# Patient Record
Sex: Female | Born: 1945 | Race: White | Hispanic: No | Marital: Married
Health system: Southern US, Community
[De-identification: ages and names within clinical notes are randomized; demographics above are authoritative.]

---

## 2020-04-29 ENCOUNTER — Other Ambulatory Visit: Payer: Self-pay | Admitting: Internal Medicine

## 2020-04-29 DIAGNOSIS — R413 Other amnesia: Secondary | ICD-10-CM

## 2020-05-14 ENCOUNTER — Ambulatory Visit
Admission: RE | Admit: 2020-05-14 | Discharge: 2020-05-14 | Disposition: A | Discharge status: Home / Self Care | Payer: Medicare Other | Source: Ambulatory Visit | Attending: Internal Medicine | Admitting: Internal Medicine

## 2020-05-14 ENCOUNTER — Encounter (INDEPENDENT_AMBULATORY_CARE_PROVIDER_SITE_OTHER): Payer: Self-pay

## 2020-05-14 ENCOUNTER — Other Ambulatory Visit: Payer: Self-pay

## 2020-05-14 DIAGNOSIS — R413 Other amnesia: Secondary | ICD-10-CM | POA: Diagnosis present

## 2020-05-14 IMAGING — MR MR HEAD W/O CM
12 series · 45 of 48 positions shown · non-contrast
Comparison: None.

CLINICAL DATA: Memory loss

EXAM:
MRI HEAD WITHOUT CONTRAST
TECHNIQUE: Multiplanar, multiecho pulse sequences of the brain and surrounding
structures were obtained without intravenous contrast.

[Series 5: ax dwi_tracew · axial · 3.0mm · 0.60mm/px · z∈[-132,+8]mm · 4 of 44 slices shown]
[im 1/44]
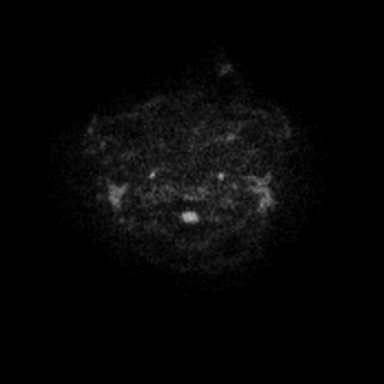
[im 15/44]
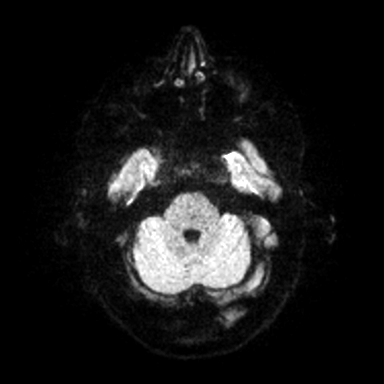
[im 29/44]
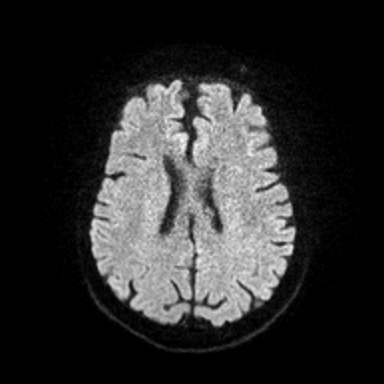
[im 44/44]
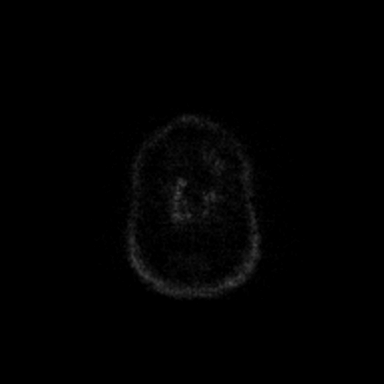

[Series 6: ax dwi_adc · axial · 3.0mm · 0.60mm/px · z∈[-132,+8]mm · 3 of 44 slices shown]
[im 1/44]
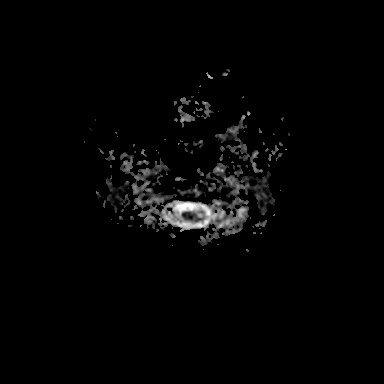
[im 22/44]
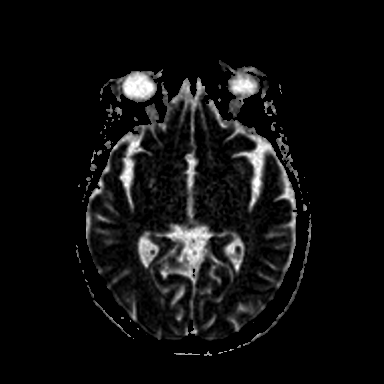
[im 44/44]
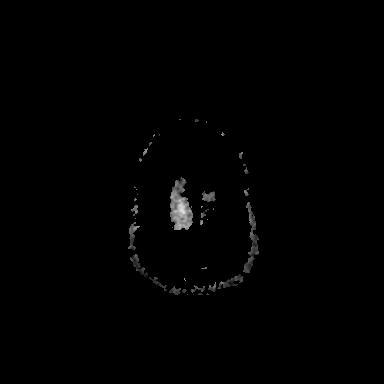

[Series 7: cor dwi_tracew · coronal · 5.0mm · 0.60mm/px · 5 of 64 slices shown]
[im 1/64]
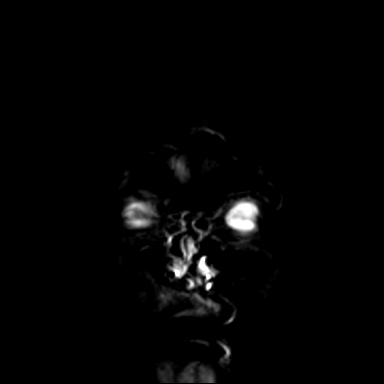
[im 16/64]
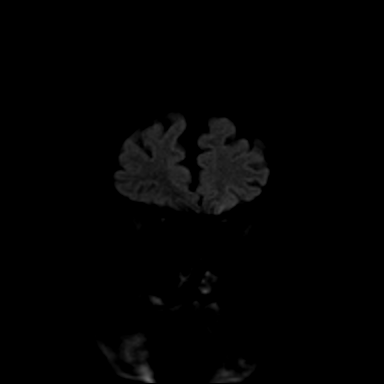
[im 32/64]
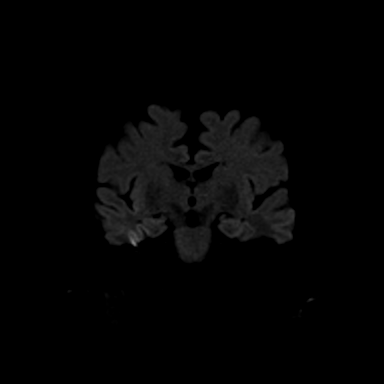
[im 48/64]
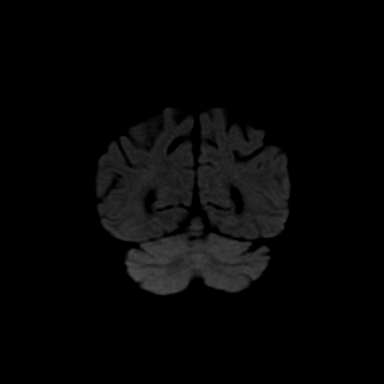
[im 64/64]
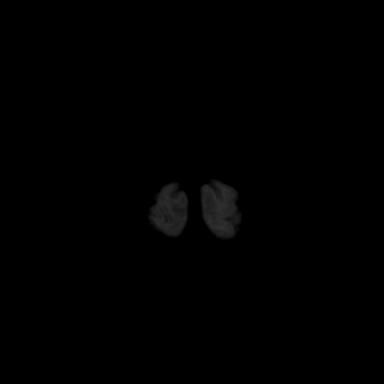

[Series 8: cor dwi_adc · coronal · 5.0mm · 0.60mm/px · 2 of 32 slices shown]
[im 1/32]
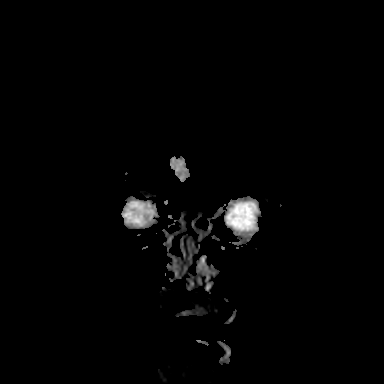
[im 32/32]
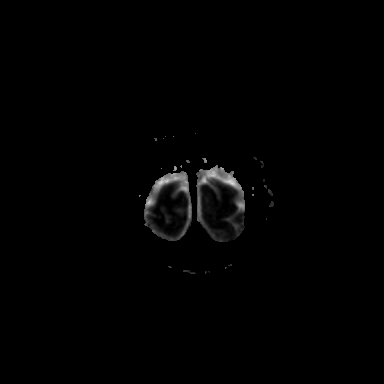

[Series 9: T1 · sagittal · 5.0mm · 0.62mm/px · 1 of 19 slices shown (1 of 2)]
[im 1/19]
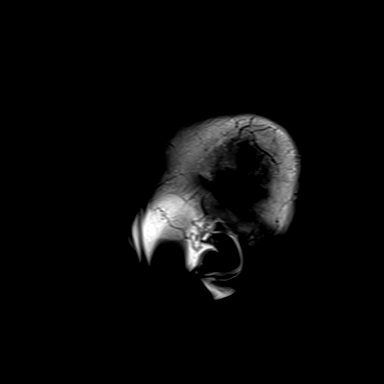

[Series 10: T2 · axial · 5.0mm · 0.53mm/px · z∈[-133,+9]mm · 2 of 25 slices shown (1 of 2)]
[im 1/25]
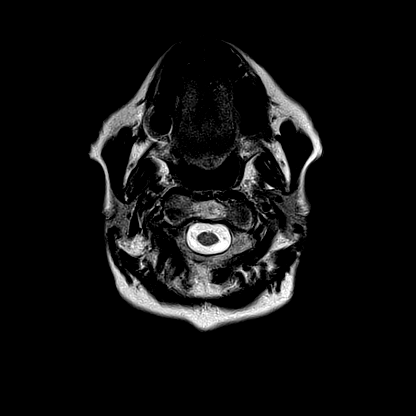
[im 25/25]
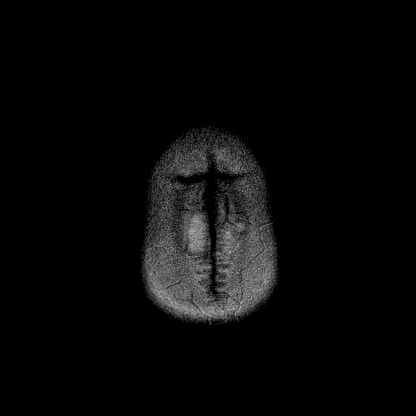

[Series 11: mag_images · axial · 3.0mm · 0.90mm/px · z∈[-148,+27]mm · 5 of 60 slices shown]
[im 1/60]
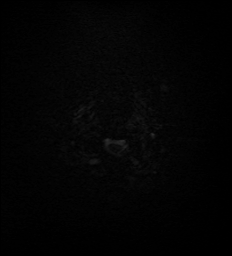
[im 15/60]
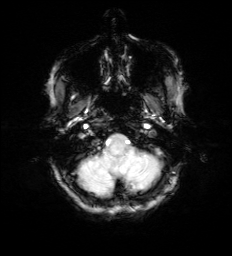
[im 30/60]
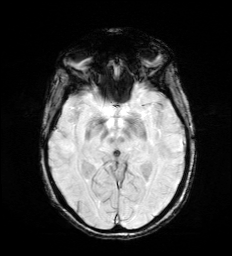
[im 45/60]
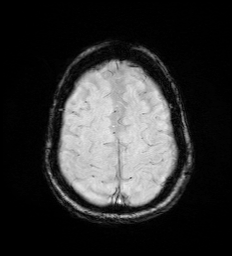
[im 60/60]
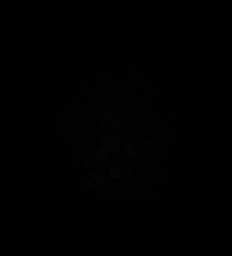

[Series 12: pha_images · axial · 3.0mm · 0.90mm/px · z∈[-148,+27]mm · 4 of 59 slices shown]
[im 1/59]
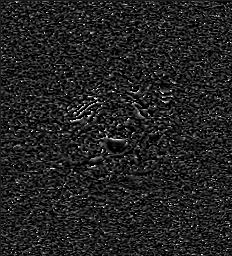
[im 20/59]
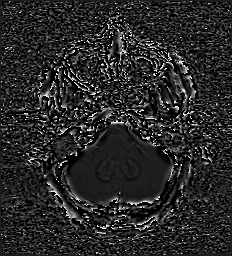
[im 39/59]
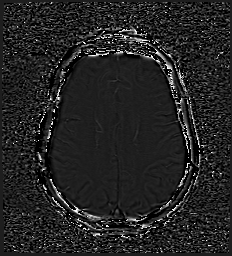
[im 59/59]
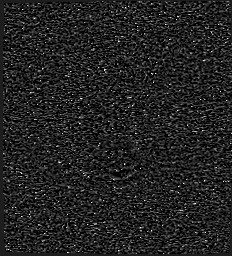

[Series 13: swi_images · axial · 3.0mm · 0.90mm/px · z∈[-148,+27]mm · 5 of 60 slices shown]
[im 1/60]
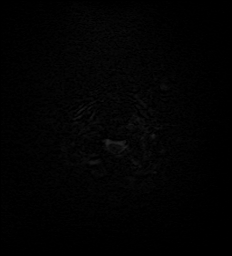
[im 15/60]
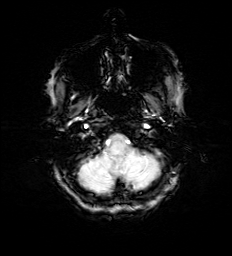
[im 30/60]
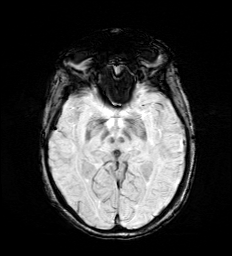
[im 45/60]
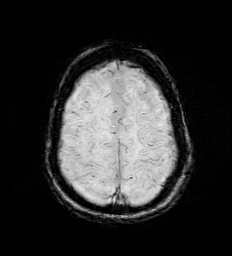
[im 60/60]
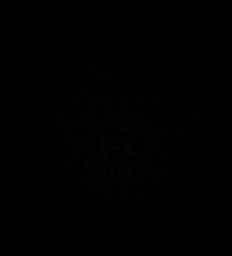

[Series 15: FLAIR · axial · 3.0mm · 0.53mm/px · z∈[-142,+18]mm · 4 of 55 slices shown]
[im 1/55]
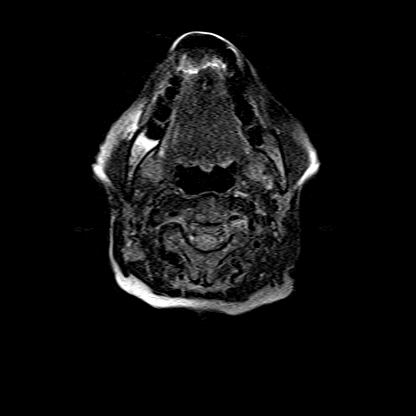
[im 19/55]
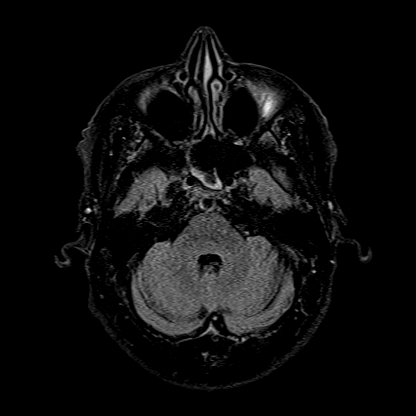
[im 37/55]
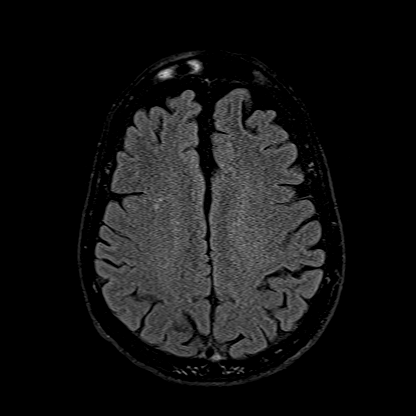
[im 55/55]
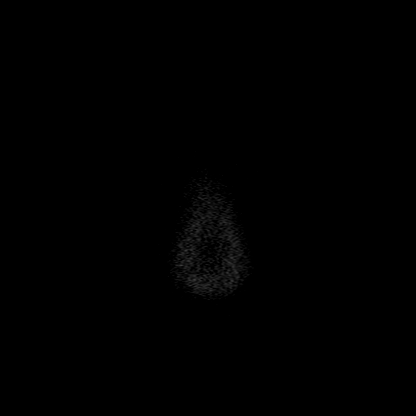

[Series 16: T1 · axial · 1.0mm · 0.98mm/px · z∈[-133,+9]mm · 8 of 144 slices shown (2 of 2)]
[im 1/144]
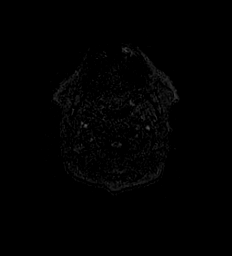
[im 29/144]
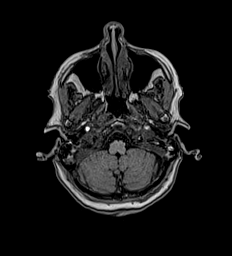
[im 43/144]
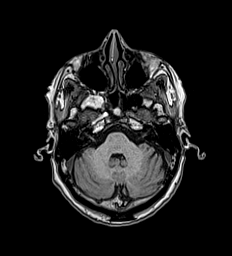
[im 58/144]
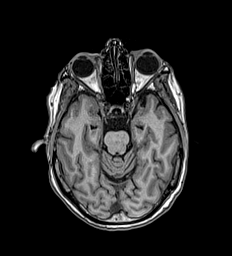
[im 86/144]
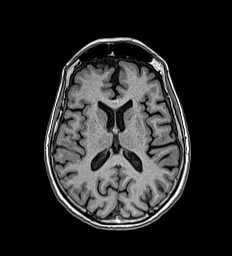
[im 101/144]
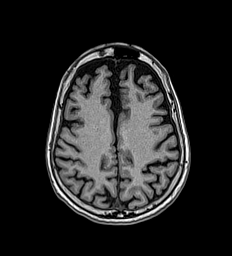
[im 115/144]
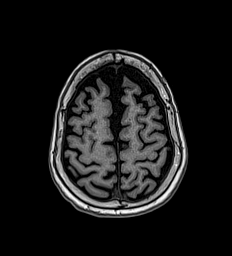
[im 144/144]
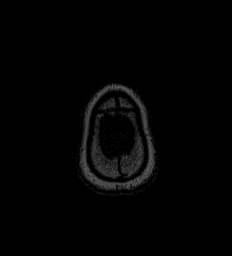

[Series 17: T2 · coronal · 5.0mm · 0.57mm/px · 2 of 26 slices shown (2 of 2)]
[im 1/26]
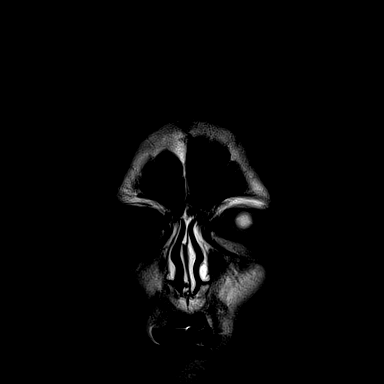
[im 26/26]
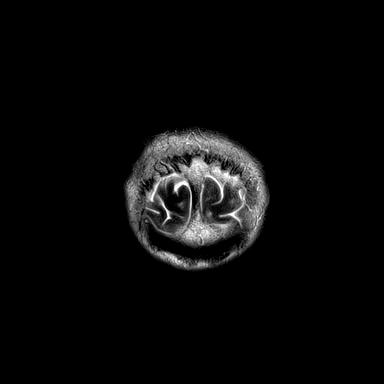

[45 of 48 positions shown; findings below may reference images not displayed]

FINDINGS: Brain: No acute infarction, hemorrhage, hydrocephalus, extra-axial
collection or mass lesion.

Prominence of the cerebral sulci, more evident in the bilateral
frontal regions, reflecting moderate parenchymal volume loss.

Vascular: Normal flow voids.

Skull and upper cervical spine: Normal marrow signal.

Sinuses/Orbits: Mucosal thickening within the right sphenoid sinus
with associated T1 hyperintense content, may represent inspissated
secretion.

Other: None.
IMPRESSION: 1. No acute intracranial abnormality.
2. Moderate parenchymal volume loss, more evident in the bilateral
frontal regions.
3. Mucosal thickening within the right sphenoid sinus with
associated T1 hyperintense content, may represent inspissated
secretion.

## 2020-05-28 ENCOUNTER — Other Ambulatory Visit: Payer: Self-pay | Admitting: Internal Medicine

## 2020-05-28 DIAGNOSIS — Z1231 Encounter for screening mammogram for malignant neoplasm of breast: Secondary | ICD-10-CM

## 2020-06-16 ENCOUNTER — Ambulatory Visit
Admission: RE | Admit: 2020-06-16 | Discharge: 2020-06-16 | Disposition: A | Discharge status: Home / Self Care | Payer: Medicare Other | Source: Ambulatory Visit | Attending: Internal Medicine | Admitting: Internal Medicine

## 2020-06-16 ENCOUNTER — Other Ambulatory Visit: Payer: Self-pay

## 2020-06-16 DIAGNOSIS — Z1231 Encounter for screening mammogram for malignant neoplasm of breast: Secondary | ICD-10-CM | POA: Insufficient documentation

## 2020-06-16 IMAGING — MG DIGITAL SCREENING BILAT W/ TOMO W/ CAD
8 series · 9 of 24 positions shown · non-contrast
Comparison: None.

CLINICAL DATA: Screening.

EXAM:
DIGITAL SCREENING BILATERAL MAMMOGRAM WITH TOMO AND CAD

[R MLO synth-2D]
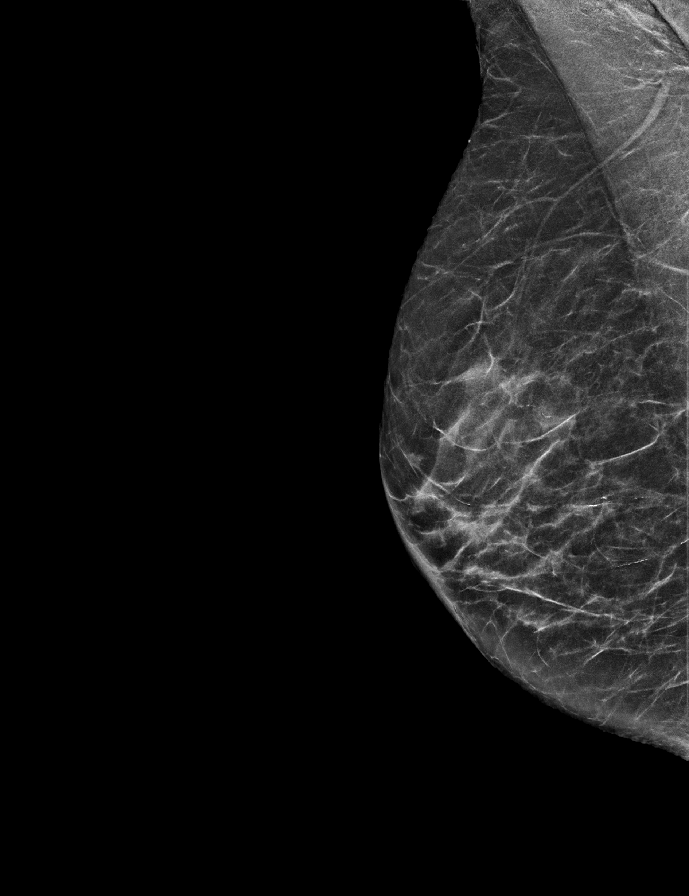

[L MLO synth-2D]
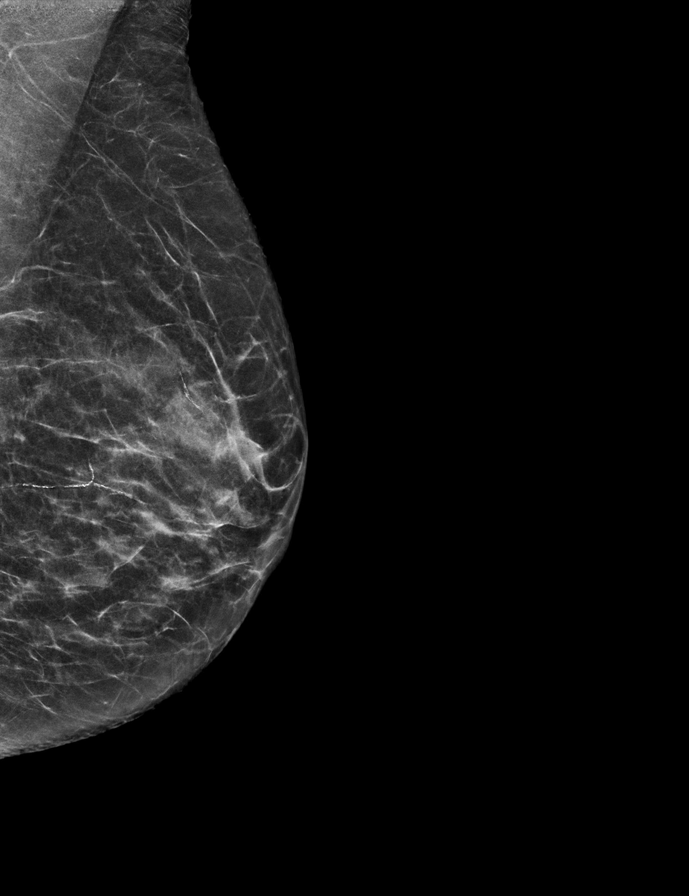

[L CC synth-2D]
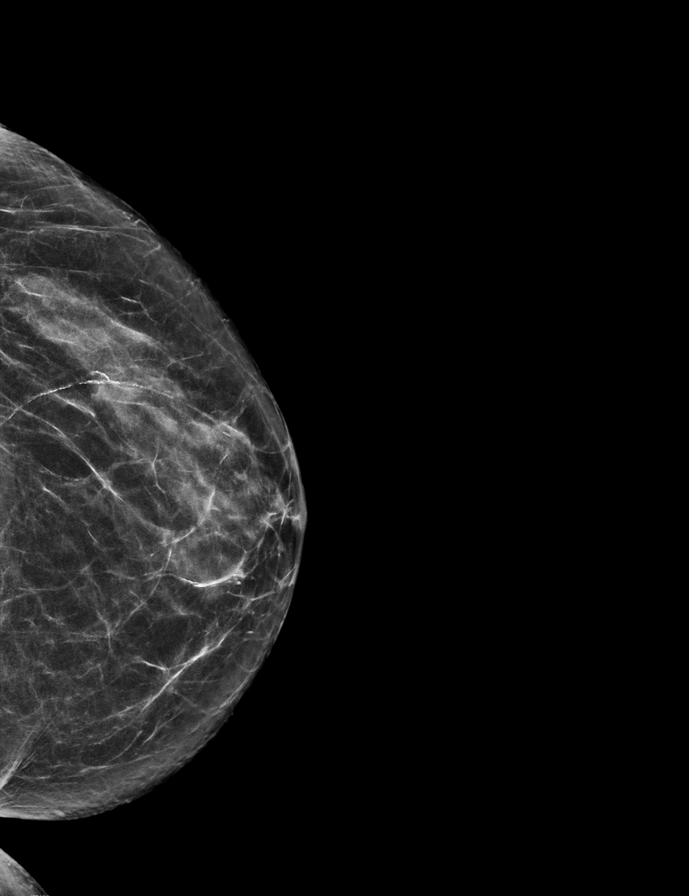

[R CC synth-2D]
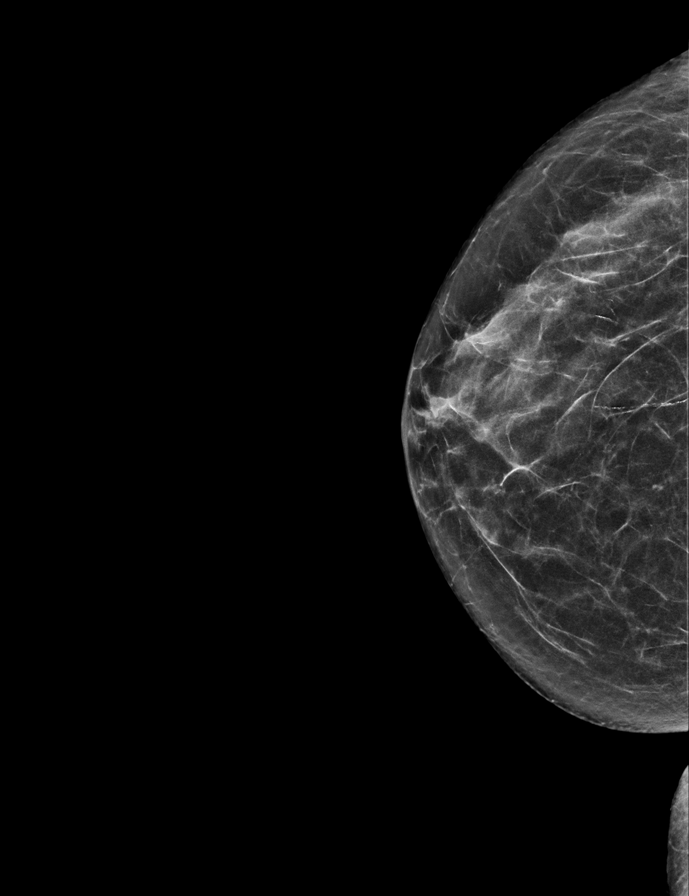

[R CC tomo · 2 of 56 frames shown]
[frame 19/56]
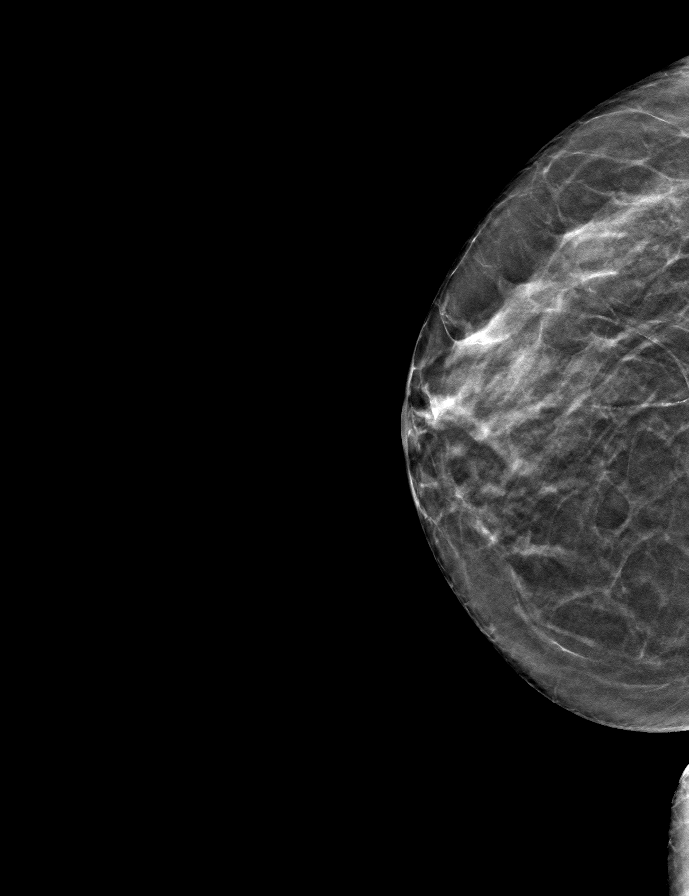
[frame 29/56]
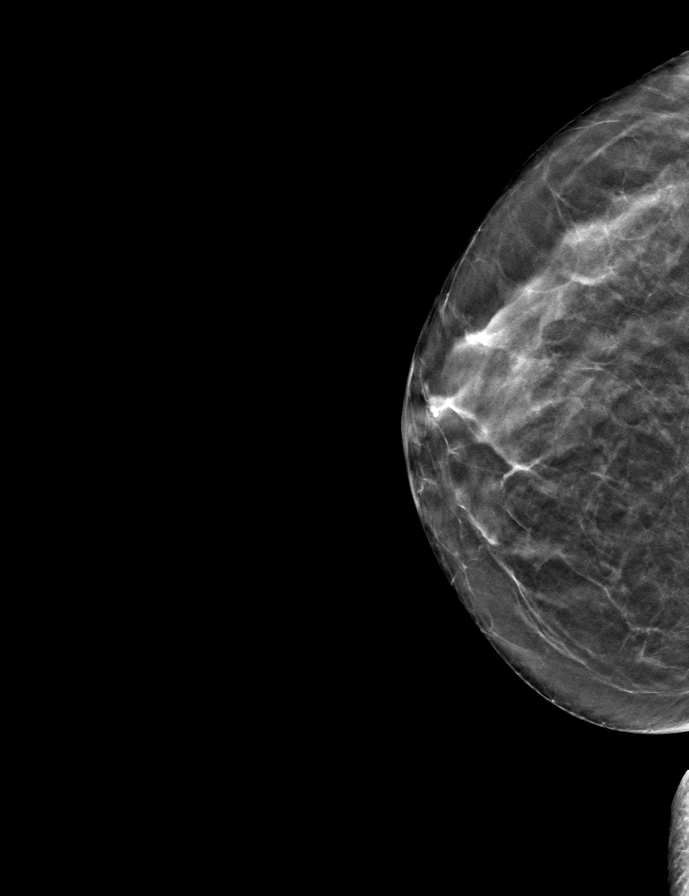

[L CC tomo · tomo slice 28/55.0]
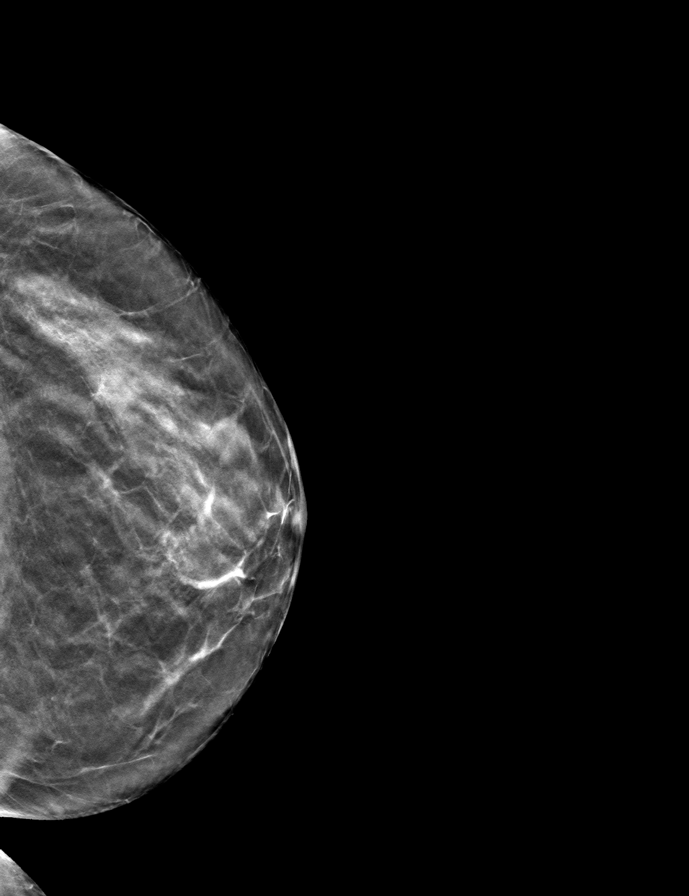

[L MLO tomo · tomo slice 26/51.0]
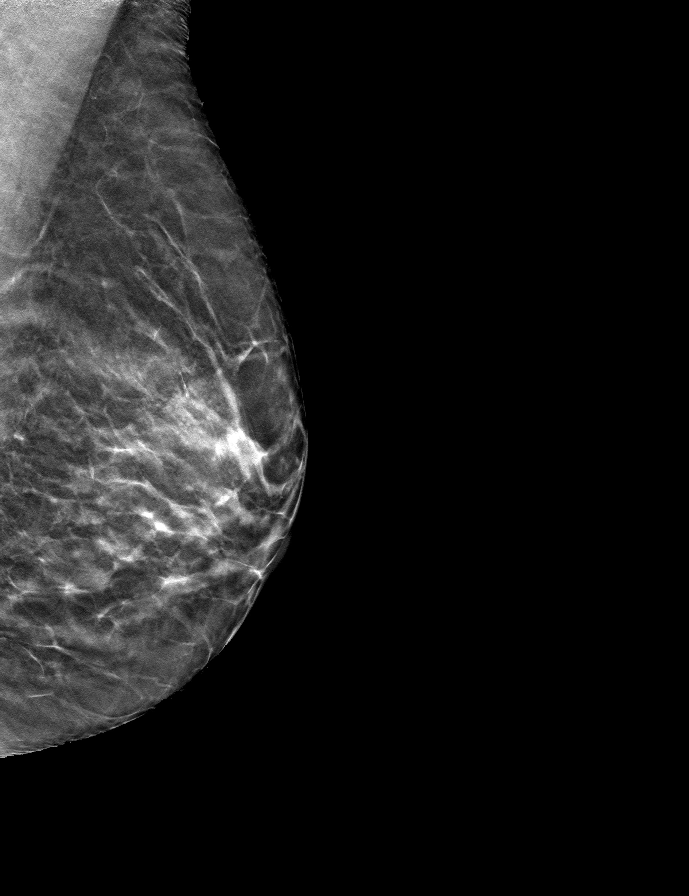

[R MLO tomo · tomo slice 26/51.0]
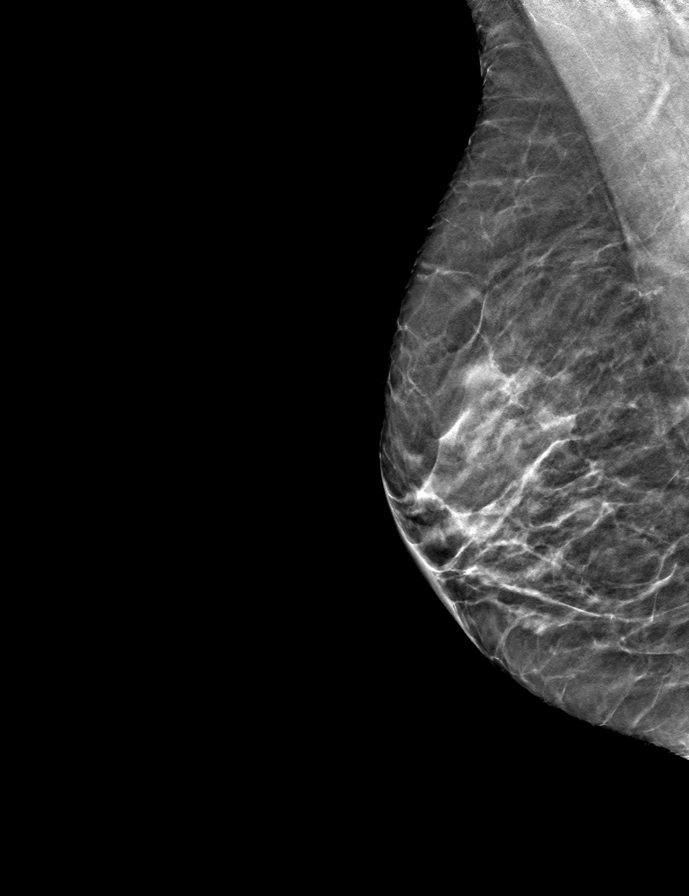

[9 of 24 positions shown; findings below may reference images not displayed]

ACR Breast Density Category c: The breast tissue is heterogeneously
dense, which may obscure small masses
FINDINGS: There are no findings suspicious for malignancy. Images were
processed with CAD.
IMPRESSION: No mammographic evidence of malignancy. A result letter of this
screening mammogram will be mailed directly to the patient.

RECOMMENDATION:
Screening mammogram in one year. (Code:[4W])

BI-RADS CATEGORY  1: Negative.

## 2020-07-12 ENCOUNTER — Ambulatory Visit: Payer: Medicare Other | Admitting: Speech Pathology

## 2020-07-12 ENCOUNTER — Other Ambulatory Visit: Payer: Self-pay

## 2020-07-12 ENCOUNTER — Ambulatory Visit: Payer: Medicare Other | Attending: Internal Medicine | Admitting: Speech Pathology

## 2020-07-12 DIAGNOSIS — R41841 Cognitive communication deficit: Secondary | ICD-10-CM | POA: Diagnosis present

## 2020-07-13 ENCOUNTER — Encounter: Payer: Self-pay | Admitting: Speech Pathology

## 2020-07-13 NOTE — Therapy (Signed)
[facility] MAIN [facility] SERVICES [address] [place], [place], [address] Phone: [phone]   Fax:  [phone]  Speech Language Pathology Evaluation  Patient Details  Name: Barbara Wilkins MRN: [id number] Date of Birth: 02-Jan-1946 Referring Provider (SLP): Junious Silk Date: 07/12/2020   End of Session - 07/13/20 1719    Visit Number 1    Number of Visits 25    Date for SLP Re-Evaluation 10/05/20    Authorization Type [employer]    Authorization Time Period 07/12/2020 thru 10/05/2020    Authorization - Visit Number 1    Progress Note Due on Visit 10    SLP Start Time 1400    SLP Stop Time  1500    SLP Time Calculation (min) 60 min    Activity Tolerance Patient tolerated treatment well           History reviewed. No pertinent past medical history.  History reviewed. No pertinent surgical history.  There were no vitals filed for this visit.   Subjective Assessment - 07/13/20 1713    Subjective pt flat affect, pleasant, conversant    Patient is accompained by: Family member    Currently in Pain? No/denies              SLP Evaluation [facility] - 07/13/20 1713      SLP Visit Information   SLP Received On 07/12/20    Referring Provider (SLP) Sherryll Burger    Onset Date 05/14/2020    Medical Diagnosis Lewy Body Dementia      Subjective   Subjective pt pleasant, flat affect    Patient/Family Stated Goal help persevere pt's cognitive function      General Information   HPI Pt is a 73 y/.o. patient of Dr Sherryll Burger who was referral for formal cognitive assessment and cognitive training d/t new diagnosis of dementia. 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 SLUMS done in office today was 10/30." MRI on 05/14/2020, which showed bilateral frontal and hippocampal atrophy, mild white matter microvascular ischemic and metabolic changes.     Behavioral/Cognition pt appropriate, poor recall    Mobility Status ambulatory      Balance Screen   Has the patient fallen in the past 6 months No    Has the patient had a decrease in activity level because of a fear of falling?  No    Is the patient reluctant to leave their home because of a fear of falling?  No      Prior Functional Status   Cognitive/Linguistic Baseline Within functional limits    Type of Home House     Lives With Spouse    Available Support Family    Education Post WWII education in [place] ~ 4 grade in [place] standards    Vocation Retired      IT consultant   Overall Cognitive Status Impaired/Different from baseline    Area of Impairment Orientation;Memory;Following commands;Awareness;Problem solving;Safety/judgement    Orientation Level Disoriented to;Time;Situation    Memory Decreased short-term memory    Following Commands Follows one step commands consistently    Safety/Judgement Decreased awareness of deficits;Decreased awareness of safety    Awareness Emergent    Problem Solving Difficulty sequencing;Requires  verbal cues    Attention Selective    Selective Attention Impaired    Selective Attention Impairment Verbal basic;Functional basic    Memory Impaired    Memory Impairment Retrieval deficit;Decreased recall of new information;Decreased short term memory    Decreased Short Term Memory Verbal basic;Functional basic    Awareness Impaired    Awareness Impairment Anticipatory impairment    Problem Solving Impaired    Problem Solving Impairment Verbal basic;Functional basic    Executive Function --   all impaired d/t lower level cognitive deficits     Auditory Comprehension   Overall Auditory Comprehension Appears  within functional limits for tasks assessed      Visual Recognition/Discrimination   Discrimination Within Function Limits      Reading Comprehension   Reading Status Not tested      Expression   Primary Mode of Expression Verbal      Verbal Expression   Overall Verbal Expression Appears within functional limits for tasks assessed      Written Expression   Dominant Hand Right    Written Expression Within Functional Limits      Oral Motor/Sensory Function   Overall Oral Motor/Sensory Function Appears within functional limits for tasks assessed      Motor Speech   Overall Motor Speech Appears within functional limits for tasks assessed                           SLP Education - 07/13/20 1719    Education Details reason for referral, dementia, specifically Lewy Body    Person(s) Educated Patient;Spouse    Methods Explanation;Demonstration;Verbal cues    Comprehension Verbalized understanding              SLP Long Term Goals - 07/13/20 1721      SLP LONG TERM GOAL #1   Title Patient  and husband will identify cognitive-communication barriers and participate in developing functional compensatory strategies.    Baseline new goal    Time 12    Period Weeks    Status New    Target Date 10/05/20            Plan - 07/13/20 1721    Speech Therapy Frequency 2x / week    Duration 12 weeks    Treatment/Interventions Cognitive reorganization;SLP instruction and feedback;Internal/external aids;Cueing hierarchy;Functional tasks;Patient/family education    Potential to Achieve Goals Good    Potential Considerations Ability to learn/carryover information    SLP Home Exercise Plan given    Consulted and Agree with Plan of Care Patient;Family member/caregiver    Family Member Consulted pt's husband           Patient will benefit from skilled therapeutic intervention in order to improve the following deficits and impairments:   Cognitive communication  deficit    Problem List There are no problems to display for this patient.   Jennafer Gladue Dreama Saa 07/13/2020, 5:23 PM  [place] [facility] MAIN [facility] SERVICES [address] [place], [place], [address] Phone: [phone]   Fax:  [phone]  Name: Barbara Wilkins MRN: [id number] Date of Birth: 1946/06/12

## 2020-07-14 ENCOUNTER — Other Ambulatory Visit: Payer: Self-pay

## 2020-07-14 ENCOUNTER — Ambulatory Visit: Payer: Medicare Other | Attending: Neurology | Admitting: Speech Pathology

## 2020-07-14 DIAGNOSIS — R41841 Cognitive communication deficit: Secondary | ICD-10-CM | POA: Diagnosis present

## 2020-07-16 NOTE — Therapy (Signed)
[place] [facility] MAIN [facility] SERVICES [address] [place], [place], [address] Phone: [phone]   Fax:  [phone]  Speech Language Pathology Treatment  Patient Details  Name: Barbara Wilkins MRN: [id number] Date of Birth: 1946/03/03 Referring Provider (SLP): Junious Silk Date: 07/14/2020   End of Session - 07/16/20 0943    Visit Number 2    Number of Visits 25    Date for SLP Re-Evaluation 10/05/20    Authorization Type [employer]    Authorization Time Period 07/12/2020 thru 10/05/2020    Authorization - Visit Number 2    Progress Note Due on Visit 10    SLP Start Time 1400    SLP Stop Time  1500    SLP Time Calculation (min) 60 min    Activity Tolerance Patient tolerated treatment well           No past medical history on file.  No past surgical history on file.  There were no vitals filed for this visit.   Subjective Assessment - 07/16/20 0941    Subjective pt flat affect, pleasant, conversant    Patient is accompained by: Family member    Currently in Pain? No/denies                 ADULT SLP TREATMENT - 07/16/20 0001      General Information   Behavior/Cognition Alert;Cooperative;Pleasant mood    HPI Pt is a 73 y/.o. patient of Dr Sherryll Burger who was referral for formal cognitive assessment and cognitive training d/t new diagnosis of dementia. 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 SLUMS done in office today was 10/30." MRI on 05/14/2020, which showed bilateral frontal and hippocampal atrophy, mild white matter microvascular ischemic and metabolic changes.       Treatment Provided    Treatment provided Cognitive-Linquistic      Pain Assessment   Pain Assessment No/denies pain      Cognitive-Linquistic Treatment   Treatment focused on Cognition;Patient/family/caregiver education    Skilled Treatment Created a basic schedule/routine for pt's day/ discussed recall of pt's medication for management      Assessment / Recommendations / Plan   Plan Continue with current plan of care      Progression Toward Goals   Progression toward goals Progressing toward goals            SLP Education - 07/16/20 0943    Education Details creating a routine to aid in memory    Person(s) Educated Patient;Spouse    Methods Explanation;Demonstration;Verbal cues    Comprehension Verbalized understanding              SLP Long Term Goals - 07/13/20 1721      SLP LONG TERM GOAL #1   Title Patient  and husband will identify cognitive-communication barriers and participate in developing functional compensatory strategies.    Baseline new goal    Time 12    Period Weeks    Status New    Target Date 10/05/20            Plan - 07/16/20 0944    Clinical Impression Statement Skilled treatment session targeted pt's cognitive communication goals. SLP facilitated session by providng  education on strategies to aid in pt's memory such as creating a routine/schedule. Pt and her husband eagerly participated and demonstrated understanding of concepts introduced.    Speech Therapy Frequency 2x / week    Duration 12 weeks    Treatment/Interventions Cognitive reorganization;SLP instruction and feedback;Internal/external aids;Cueing hierarchy;Functional tasks;Patient/family education    Potential to Achieve Goals Good    Potential Considerations Ability to learn/carryover information    SLP Home Exercise Plan given    Consulted and Agree with Plan of Care Patient;Family member/caregiver    Family Member Consulted pt's husband           Patient will benefit from skilled therapeutic  intervention in order to improve the following deficits and impairments:   Cognitive communication deficit    Problem List There are no problems to display for this patient.  Lorenso Quirino B. Dreama Saa M.S., CCC-SLP, [facility] Speech-Language Pathologist Rehabilitation Services Office [phone]   Reuel Derby 07/16/2020, 9:46 AM  [place] [facility] MAIN [facility] SERVICES [address] [place], [place], [address] Phone: [phone]   Fax:  [phone]   Name: Barbara Wilkins MRN: [id number] Date of Birth: [birth year]

## 2020-07-21 ENCOUNTER — Other Ambulatory Visit: Payer: Self-pay

## 2020-07-21 ENCOUNTER — Ambulatory Visit: Payer: Medicare Other | Admitting: Speech Pathology

## 2020-07-21 DIAGNOSIS — R41841 Cognitive communication deficit: Secondary | ICD-10-CM | POA: Diagnosis not present

## 2020-07-22 ENCOUNTER — Encounter: Payer: Self-pay | Admitting: Speech Pathology

## 2020-07-22 ENCOUNTER — Ambulatory Visit: Payer: BC Managed Care – PPO | Admitting: Speech Pathology

## 2020-07-22 NOTE — Therapy (Signed)
[place] [facility] MAIN [facility] SERVICES [address] [place], [place], [address] Phone: [phone]   Fax:  [phone]  Speech Language Pathology Treatment  Patient Details  Name: Barbara Wilkins MRN: [id number] Date of Birth: 1946-03-16 Referring Provider (SLP): Junious Silk Date: 07/21/2020   End of Session - 07/22/20 1342    Visit Number 3    Number of Visits 25    Date for SLP Re-Evaluation 10/05/20    Authorization Type [employer] Medicare    Authorization Time Period 07/12/2020 thru 10/05/2020    Authorization - Visit Number 3    Progress Note Due on Visit 10    SLP Start Time 1400    SLP Stop Time  1600    SLP Time Calculation (min) 120 min    Activity Tolerance Patient tolerated treatment well           History reviewed. No pertinent past medical history.  History reviewed. No pertinent surgical history.  There were no vitals filed for this visit.   Subjective Assessment - 07/22/20 1341    Subjective pt flat affect, pleasant, conversant    Patient is accompained by: Family member    Currently in Pain? No/denies                 ADULT SLP TREATMENT - 07/22/20 0001      General Information   Behavior/Cognition Alert;Cooperative;Pleasant mood    HPI Pt is a 73 y/.o. patient of Dr Barbara Wilkins who was referral for formal cognitive assessment and cognitive training d/t new diagnosis of dementia. 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 SLUMS done in office today was 10/30." MRI on 05/14/2020, which showed bilateral frontal and hippocampal atrophy, mild white matter microvascular ischemic and metabolic  changes.       Treatment Provided   Treatment provided Cognitive-Linquistic      Pain Assessment   Pain Assessment No/denies pain      Cognitive-Linquistic Treatment   Treatment focused on Cognition;Patient/family/caregiver education    Skilled Treatment Total assistance required to generate list of medicines, function of medicine as well as administration of medicine.        Assessment / Recommendations / Plan   Plan Continue with current plan of care      Progression Toward Goals   Progression toward goals Progressing toward goals            SLP Education - 07/22/20 1342    Education Details medication management    Person(s) Educated Patient;Spouse    Methods Explanation;Demonstration;Verbal cues    Comprehension Verbalized understanding              SLP Long Term Goals - 07/13/20 1721      SLP LONG TERM GOAL #1   Title Patient  and husband will identify cognitive-communication barriers and participate in developing functional compensatory strategies.    Baseline new goal    Time 12    Period Weeks    Status New    Target Date 10/05/20            Plan - 07/22/20 1343    Clinical Impression Statement Skilled treatment session targeted pt's cognitive communication goals. SLP facilitated session by  providing maximal assistance with current medication list, function of each medicine and function was written on the bottle. Secure chat sent to pt's MD Nemiah Commander), message faxed and spoke with her nurse Toni Amend) regarding several questions pt and her husband had regarding 2 medicines (Zoloft and Amitriptyline). During the next session, we will target medicine organization with pill box. Pt and her husband report positive feedback regarding establishing a routine/schedule.    Speech Therapy Frequency 2x / week    Duration 12 weeks    Treatment/Interventions Cognitive reorganization;SLP instruction and feedback;Internal/external aids;Cueing hierarchy;Functional  tasks;Patient/family education    Potential to Achieve Goals Good    Potential Considerations Ability to learn/carryover information    SLP Home Exercise Plan given    Consulted and Agree with Plan of Care Patient;Family member/caregiver    Family Member Consulted pt's husband           Patient will benefit from skilled therapeutic intervention in order to improve the following deficits and impairments:   Cognitive communication deficit    Problem List There are no problems to display for this patient.  Channell Quattrone B. Dreama Saa M.S., CCC-SLP, [facility] Speech-Language Pathologist Rehabilitation Services Office [phone]  Reuel Derby 07/22/2020, 1:44 PM  [place] [facility] MAIN [facility] SERVICES [address] [place], [place], [address] Phone: [phone]   Fax:  [phone]   Name: TAKERRA Wilkins MRN: [id number] Date of Birth: 10/03/1946

## 2020-07-23 ENCOUNTER — Other Ambulatory Visit: Payer: Self-pay

## 2020-07-23 ENCOUNTER — Ambulatory Visit: Payer: Medicare Other | Admitting: Speech Pathology

## 2020-07-23 ENCOUNTER — Encounter: Payer: Self-pay | Admitting: Speech Pathology

## 2020-07-23 DIAGNOSIS — R41841 Cognitive communication deficit: Secondary | ICD-10-CM | POA: Diagnosis not present

## 2020-07-23 NOTE — Therapy (Addendum)
[facility] MAIN [facility] SERVICES [address] [place], [place], [address] Phone: [phone]   Fax:  [phone]  Speech Language Pathology Treatment  Patient Details  Name: Barbara Wilkins MRN: [id number] Date of Birth: 11/07/1946 Referring Provider (SLP): Junious Silk Date: 07/23/2020   End of Session - 07/23/20 1417    Visit Number 4    Number of Visits 25    Date for SLP Re-Evaluation 10/05/20    Authorization Type [employer]    Authorization Time Period 07/12/2020 thru 10/05/2020    Authorization - Visit Number 4    Progress Note Due on Visit 10    SLP Start Time 1100    SLP Stop Time  1200    SLP Time Calculation (min) 60 min    Activity Tolerance Patient tolerated treatment well           History reviewed. No pertinent past medical history.  History reviewed. No pertinent surgical history.  There were no vitals filed for this visit.   Subjective Assessment - 07/23/20 1416    Subjective pt flat affect, pleasant, conversant    Patient is accompained by: Family member    Currently in Pain? No/denies                 ADULT SLP TREATMENT - 07/23/20 0001      General Information   Behavior/Cognition Alert;Cooperative;Pleasant mood    HPI Pt is a 73 y/.o. patient of Dr Barbara Wilkins who was referral for formal cognitive assessment and cognitive training d/t new diagnosis of dementia. 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 SLUMS done in office today was 10/30." MRI on 05/14/2020, which showed bilateral frontal and hippocampal atrophy, mild white matter microvascular ischemic and metabolic  changes.       Treatment Provided   Treatment provided Cognitive-Linquistic      Pain Assessment   Pain Assessment No/denies pain      Cognitive-Linquistic Treatment   Treatment focused on Cognition;Patient/family/caregiver education    Skilled Treatment Education provided on cognitive tasks that frontal lobes are responsible for performing. Pt and her husband endorse short term memory problems - see clinical impressions statement       Assessment / Recommendations / Plan   Plan Continue with current plan of care      Progression Toward Goals   Progression toward goals Progressing toward goals            SLP Education - 07/23/20 1416    Education Details cognitive training and it's role in preventing cognitive decline    Person(s) Educated Patient;Spouse    Methods Explanation;Demonstration;Verbal cues    Comprehension Verbalized understanding              SLP Long Term Goals - 07/13/20 1721      SLP LONG TERM GOAL #1   Title Patient  and husband will identify cognitive-communication barriers and participate in developing functional compensatory strategies.    Baseline new goal    Time 12    Period Weeks    Status New    Target Date 10/05/20            Plan - 07/23/20 1417  Clinical Impression Statement Skilled treatment session focused on pt's cognitive training. SLP facilitated session by discussing increasing pt's participation in her medication management such as knowing what each medicine targets. She stated that she helps her husband organize her medicines and wishes for that to remain the same.  They provide that their daily routine/schedule is continuing to show benefits as well as pt feels that she is able to "think better" with Aricept and Namenda. Pt and husband provided functional examples of pt's short-term memory deficits. Some of the examples appeared valid such as not remembering which box their passports were packed in. Several were acute deficits  such as pt looking at calendar and then unable to recall information after a short delay. Strategies created to help with short-term recall.  Will continue to target cognitive training.    Speech Therapy Frequency 2x / week    Duration 12 weeks    Treatment/Interventions Cognitive reorganization;SLP instruction and feedback;Internal/external aids;Cueing hierarchy;Functional tasks;Patient/family education    Potential to Achieve Goals Good    Potential Considerations Ability to learn/carryover information    SLP Home Exercise Plan given    Consulted and Agree with Plan of Care Patient;Family member/caregiver    Family Member Consulted pt's husband           Patient will benefit from skilled therapeutic intervention in order to improve the following deficits and impairments:   Cognitive communication deficit    Problem List There are no problems to display for this patient.  Andreal Vultaggio B. Dreama Saa M.S., CCC-SLP, [facility] Speech-Language Pathologist Rehabilitation Services Office [phone]  Reuel Derby 07/23/2020, 2:18 PM  [facility] MAIN [facility] SERVICES [address] [place], [place], [address] Phone: [phone]   Fax:  [phone]   Name: Barbara Wilkins MRN: [id number] Date of Birth: 1946-06-02

## 2020-07-27 ENCOUNTER — Ambulatory Visit: Payer: Medicare Other | Admitting: Speech Pathology

## 2020-07-27 ENCOUNTER — Other Ambulatory Visit: Payer: Self-pay

## 2020-07-27 DIAGNOSIS — R41841 Cognitive communication deficit: Secondary | ICD-10-CM

## 2020-07-28 ENCOUNTER — Encounter: Payer: Self-pay | Admitting: Speech Pathology

## 2020-07-29 ENCOUNTER — Encounter: Payer: Self-pay | Admitting: Speech Pathology

## 2020-07-29 ENCOUNTER — Ambulatory Visit: Payer: Medicare Other | Admitting: Speech Pathology

## 2020-07-29 ENCOUNTER — Other Ambulatory Visit: Payer: Self-pay

## 2020-07-29 DIAGNOSIS — R41841 Cognitive communication deficit: Secondary | ICD-10-CM

## 2020-07-29 NOTE — Therapy (Signed)
[facility] [facility] MAIN [facility] SERVICES [address] [place], [place], [address] Phone: [phone]   Fax:  [phone]  Speech Language Pathology Treatment  Patient Details  Name: Barbara Wilkins MRN: [id number] Date of Birth: 09/27/1946 Referring Provider (SLP): Junious Silk Date: 07/27/2020   End of Session - 07/29/20 1324    Visit Number 5    Number of Visits 25    Date for SLP Re-Evaluation 10/05/20    Authorization Type [employer]    Authorization Time Period 07/12/2020 thru 10/05/2020    Authorization - Visit Number 5    Progress Note Due on Visit 10    SLP Start Time 1430    SLP Stop Time  1530    SLP Time Calculation (min) 60 min    Activity Tolerance Patient tolerated treatment well           History reviewed. No pertinent past medical history.  History reviewed. No pertinent surgical history.  There were no vitals filed for this visit.   Subjective Assessment - 07/28/20 1600    Subjective pt flat affect, pleasant, conversant    Patient is accompained by: Family member    Currently in Pain? No/denies                 ADULT SLP TREATMENT - 07/29/20 0001      General Information   Behavior/Cognition Alert;Cooperative;Pleasant mood    Education provided on cognitive tasks targeting cognitive training, purpose of cognitive training in adults with dementia, informal structured assessment of pt's cognitive abilities, moderate assistance required to return demonstrate basic recall task after auditory reading task            SLP Education - 07/29/20 1323    Education Details activities to promote cognitive stimulation during the day    Person(s) Educated Patient;Spouse    Methods Explanation;Demonstration;Verbal cues    Comprehension Verbalized understanding              SLP Long Term Goals - 07/13/20 1721      SLP LONG TERM GOAL #1   Title Patient  and husband will identify  cognitive-communication barriers and participate in developing functional compensatory strategies.    Baseline new goal    Time 12    Period Weeks    Status New    Target Date 10/05/20            Plan - 07/29/20 1325    Clinical Impression Statement Education completed on characteristics and prognosis of current diagnosis. Activities created within pt's daily living tasks to increase pt's recall of information, organize and sequence tasks as well as problem solve daily activities. As a result, of the above, informal assessment of pt's cognitive function administered. Pt able to correctly answer basic and complex orientation questions, follow up to 3 step linguistically complex directions, good selective attention, verbal problem solving with significant deficits in immediate and delayed recall of information. To aid in recall of immediate information, this writer demonstrated and instructed pt's husband to read aloud small portion of magazine (provided by this Clinical research associate) or book and have pt recall/retell one detail about text. Pt and her husband able to return demonstrate concept. Pt utilizing a calendar at home for recall of date but is unable to retain information after looking at calendar. Recommend pt keep calendar in her purse to recall information easily. Pt and her husband voiced understanding of information with all questions answered to pt and husband's  satisfaction.    Speech Therapy Frequency 2x / week    Duration 12 weeks    Treatment/Interventions Cognitive reorganization;SLP instruction and feedback;Internal/external aids;Cueing hierarchy;Functional tasks;Patient/family education    Potential to Achieve Goals Good    Potential Considerations Ability to learn/carryover information    SLP Home Exercise Plan given    Consulted and Agree with Plan of Care Patient;Family member/caregiver    Family Member Consulted pt's husband           Patient will benefit from skilled therapeutic  intervention in order to improve the following deficits and impairments:   Cognitive communication deficit    Problem List There are no problems to display for this patient.  Denese Mentink B. Dreama Saa M.S., CCC-SLP, [facility] Speech-Language Pathologist Rehabilitation Services Office [phone]  Reuel Derby 07/29/2020, 1:26 PM  [place] [facility] MAIN [facility] SERVICES [address] [place], [place], [address] Phone: [phone]   Fax:  [phone]   Name: Barbara Wilkins MRN: [id number] Date of Birth: 11/24/1945

## 2020-07-30 NOTE — Therapy (Signed)
[place] [facility] MAIN [facility] SERVICES [address] [place], [place], [address] Phone: [phone]   Fax:  [phone]  Speech Language Pathology Treatment  Patient Details  Name: Barbara Wilkins MRN: [id number] Date of Birth: [birth year] Referring Provider (SLP): Junious Silk Date: 07/29/2020   End of Session - 07/30/20 1535    Visit Number 6    Number of Visits 25    Date for SLP Re-Evaluation 10/05/20    Authorization Type [employer]    Authorization Time Period 07/12/2020 thru 10/05/2020    Authorization - Visit Number 6    Progress Note Due on Visit 10    SLP Start Time 1130    SLP Stop Time  1230    SLP Time Calculation (min) 60 min    Activity Tolerance Patient tolerated treatment well           No past medical history on file.  No past surgical history on file.  There were no vitals filed for this visit.   Subjective Assessment - 07/30/20 1534    Subjective pt flat affect, pleasant, conversant    Patient is accompained by: Family member    Currently in Pain? No/denies                 ADULT SLP TREATMENT - 07/30/20 0001      General Information   Behavior/Cognition Alert;Cooperative;Pleasant mood    HPI Pt is a 73 y/.o. patient of Dr Sherryll Burger who was referral for formal cognitive assessment and cognitive training d/t new diagnosis of dementia. 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 SLUMS done in office today was 10/30." MRI on 05/14/2020, which showed bilateral frontal and hippocampal atrophy, mild white matter microvascular ischemic and metabolic changes.       Treatment Provided    Treatment provided Cognitive-Linquistic      Pain Assessment   Pain Assessment No/denies pain      Cognitive-Linquistic Treatment   Treatment focused on Cognition;Patient/family/caregiver education    Skilled Treatment Minimal cues required to use strategies that increase efficiency of solving word searches       Assessment / Recommendations / Plan   Plan Continue with current plan of care      Progression Toward Goals   Progression toward goals Progressing toward goals            SLP Education - 07/30/20 1535    Education Details need to create functional strategies to practice so strategies become involuntary    Person(s) Educated Patient;Spouse    Methods Explanation;Demonstration;Verbal cues    Comprehension Verbalized understanding              SLP Long Term Goals - 07/13/20 1721      SLP LONG TERM GOAL #1   Title Patient  and husband will identify cognitive-communication barriers and participate in developing functional compensatory strategies.    Baseline new goal    Time 12    Period Weeks    Status New    Target Date 10/05/20            Plan - 07/30/20 1536    Clinical Impression Statement Skilled treatment session targeted pt's cognitive training. SLP facilitated  session by providing strategies to increase accuracy and efficiency. Main strategies included pt stopping at each specified letter and searching around each one. Pt frequently went by or briefly looked at each letter. With moderate faded to minimal cues, pt able to utilize strategy and increase accuracy and efficiency.    Speech Therapy Frequency 2x / week    Duration 12 weeks    Treatment/Interventions Cognitive reorganization;SLP instruction and feedback;Internal/external aids;Cueing hierarchy;Functional tasks;Patient/family education    Potential to Achieve Goals Good    Potential Considerations Ability to learn/carryover information    SLP Home Exercise Plan given    Consulted and Agree  with Plan of Care Patient;Family member/caregiver    Family Member Consulted pt's husband           Patient will benefit from skilled therapeutic intervention in order to improve the following deficits and impairments:   Cognitive communication deficit    Problem List There are no problems to display for this patient.  Caryn Gienger B. Dreama Saa M.S., CCC-SLP, [facility] Speech-Language Pathologist Rehabilitation Services Office [phone]  Reuel Derby 07/30/2020, 3:37 PM  [facility] MAIN [facility] SERVICES [address] [place], [place], [address] Phone: [phone]   Fax:  [phone]   Name: Barbara Wilkins MRN: [id number] Date of Birth: 1946-11-07

## 2020-08-03 ENCOUNTER — Other Ambulatory Visit: Payer: Self-pay

## 2020-08-03 ENCOUNTER — Ambulatory Visit: Payer: Medicare Other | Admitting: Speech Pathology

## 2020-08-03 ENCOUNTER — Encounter: Payer: Self-pay | Admitting: Speech Pathology

## 2020-08-03 DIAGNOSIS — R41841 Cognitive communication deficit: Secondary | ICD-10-CM

## 2020-08-03 NOTE — Therapy (Signed)
[place] [facility] MAIN [facility] SERVICES [address] [place], [place], [address] Phone: [phone]   Fax:  [phone]  Speech Language Pathology Treatment  Patient Details  Name: Barbara Wilkins MRN: [id number] Date of Birth: 02-Jun-1946 Referring Provider (SLP): Junious Silk Date: 08/03/2020   End of Session - 08/03/20 1819    Visit Number 7    Number of Visits 25    Date for SLP Re-Evaluation 10/05/20    Authorization Type [employer]    Authorization Time Period 07/12/2020 thru 10/05/2020    Authorization - Visit Number 7    Progress Note Due on Visit 10    SLP Start Time 1100    SLP Stop Time  1200    SLP Time Calculation (min) 60 min    Activity Tolerance Patient tolerated treatment well           History reviewed. No pertinent past medical history.  History reviewed. No pertinent surgical history.  There were no vitals filed for this visit.   Subjective Assessment - 08/03/20 1817    Subjective pt pleasant with increased facial expressions    Patient is accompained by: Family member    Currently in Pain? No/denies                 ADULT SLP TREATMENT - 08/03/20 0001      General Information   Behavior/Cognition Alert;Cooperative;Pleasant mood    HPI Pt is a 73 y/.o. patient of Dr Sherryll Burger who was referral for formal cognitive assessment and cognitive training d/t new diagnosis of dementia. 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 SLUMS done in office today was 10/30." MRI on 05/14/2020, which showed bilateral frontal and hippocampal atrophy, mild white matter microvascular ischemic and  metabolic changes.       Treatment Provided   Treatment provided Cognitive-Linquistic      Pain Assessment   Pain Assessment No/denies pain      Cognitive-Linquistic Treatment   Treatment focused on Cognition;Patient/family/caregiver education    Skilled Treatment Pt has progress to independently completing word search puzzles, she also developed appropriate strategies to help her independently complete them, maximal multi-modal cues when introducing Solitaire       Assessment / Recommendations / Plan   Plan Continue with current plan of care      Progression Toward Goals   Progression toward goals Progressing toward goals            SLP Education - 08/03/20 1818    Education Details how to use functional strategies to set-up Solitaire    Person(s) Educated Patient;Spouse    Methods Explanation;Demonstration;Verbal cues;Handout    Comprehension Verbalized understanding              SLP Long Term Goals - 07/13/20 1721      SLP LONG TERM GOAL #1   Title Patient  and husband will identify cognitive-communication barriers and participate in developing functional compensatory strategies.    Baseline new goal    Time 12    Period Weeks    Status New    Target Date 10/05/20            Plan - 08/03/20 1819  Clinical Impression Statement Skilled treatment session focused on cognitive training. Pt returned to session completely independent when completing word search puzzle. In fact, she had created additional strategies to further help her independence. This demonstrated great insight into solving current issues. SLP further introduced multi-modal strategies to learn Solitaire.    Speech Therapy Frequency 2x / week    Duration 12 weeks    Treatment/Interventions Cognitive reorganization;SLP instruction and feedback;Internal/external aids;Cueing hierarchy;Functional tasks;Patient/family education    Potential to Achieve Goals Good    SLP Home Exercise Plan use strategies  to set-up Solitaire    Consulted and Agree with Plan of Care Patient;Family member/caregiver    Family Member Consulted pt's husband           Patient will benefit from skilled therapeutic intervention in order to improve the following deficits and impairments:   Cognitive communication deficit    Problem List There are no problems to display for this patient.  Tareek Sabo B. Dreama Saa M.S., CCC-SLP, [facility] Speech-Language Pathologist Rehabilitation Services Office [phone]  Reuel Derby 08/03/2020, 6:21 PM  [facility] MAIN [facility] SERVICES [address] [place], [place], [address] Phone: [phone]   Fax:  [phone]   Name: Barbara Wilkins MRN: [id number] Date of Birth: 04-22-1946

## 2020-08-05 ENCOUNTER — Ambulatory Visit: Payer: Medicare Other | Admitting: Speech Pathology

## 2020-08-05 ENCOUNTER — Other Ambulatory Visit: Payer: Self-pay

## 2020-08-05 DIAGNOSIS — R41841 Cognitive communication deficit: Secondary | ICD-10-CM | POA: Diagnosis not present

## 2020-08-06 ENCOUNTER — Encounter: Payer: Self-pay | Admitting: Speech Pathology

## 2020-08-06 NOTE — Therapy (Signed)
[facility] MAIN [facility] SERVICES [address] [place], [place], [address] Phone: [phone]   Fax:  [phone]  Speech Language Pathology Treatment  Patient Details  Name: Barbara Wilkins MRN: [id number] Date of Birth: 1946-03-29 Referring Provider (SLP): Junious Silk Date: 08/05/2020   End of Session - 08/06/20 1202    Visit Number 8    Number of Visits 25    Date for SLP Re-Evaluation 10/05/20    Authorization Type [employer]    Authorization Time Period 07/12/2020 thru 10/05/2020    Authorization - Visit Number 8    Progress Note Due on Visit 10    SLP Start Time 1100    SLP Stop Time  1200    SLP Time Calculation (min) 60 min    Activity Tolerance Patient tolerated treatment well           History reviewed. No pertinent past medical history.  History reviewed. No pertinent surgical history.  There were no vitals filed for this visit.   Subjective Assessment - 08/06/20 1200    Subjective pt pleasant with increased facial expressions    Patient is accompained by: Family member    Currently in Pain? No/denies                 ADULT SLP TREATMENT - 08/06/20 0001      General Information   Behavior/Cognition Alert;Cooperative;Pleasant mood    HPI Pt is a 73 y/.o. patient of Dr Sherryll Burger who was referral for formal cognitive assessment and cognitive training d/t new diagnosis of dementia. 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 SLUMS done in office today was 10/30." MRI on 05/14/2020, which showed bilateral frontal and hippocampal atrophy, mild white matter microvascular ischemic and  metabolic changes.       Treatment Provided   Treatment provided Cognitive-Linquistic      Pain Assessment   Pain Assessment No/denies pain      Cognitive-Linquistic Treatment   Treatment focused on Cognition;Patient/family/caregiver education    Skilled Treatment Multi-modal cues/strategies utilized to aid pt in planning, organizing and sequencing novel card task      Assessment / Recommendations / Plan   Plan Continue with current plan of care      Progression Toward Goals   Progression toward goals Progressing toward goals            SLP Education - 08/06/20 1201    Education Details cognitive hierarchy    Person(s) Educated Patient;Spouse    Methods Explanation;Demonstration;Verbal cues;Handout    Comprehension Verbalized understanding              SLP Long Term Goals - 07/13/20 1721      SLP LONG TERM GOAL #1   Title Patient  and husband will identify cognitive-communication barriers and participate in developing functional compensatory strategies.    Baseline new goal    Time 12    Period Weeks    Status New    Target Date 10/05/20            Plan - 08/06/20 1202    Clinical Impression Statement Skilled treatment session focused on pt's cognitive training. SLP facilitated this by providing multi-modal cues to  aid pt in setting up the novel game of Solitaire. In addition to executive function targets, pt displayed some lower deficits that need to be strengthen prior to executive function. Pt needs to generalize and habituate left to right scanning/completion of a task and quick set shifting while problem solving. Pt tends to fault herself and becomes emotional regarding her inability to complete activities. We will try to select 1 specific goal within a task for each therapy session to help pt recognize success and the incremental progress that is necessary for global progress in cognitive training.    Speech Therapy Frequency 2x / week    Duration 12 weeks     Treatment/Interventions Cognitive reorganization;SLP instruction and feedback;Internal/external aids;Cueing hierarchy;Functional tasks;Patient/family education    Potential to Achieve Goals Good    Potential Considerations Ability to learn/carryover information    SLP Home Exercise Plan provided    Consulted and Agree with Plan of Care Patient;Family member/caregiver    Family Member Consulted pt's husband           Patient will benefit from skilled therapeutic intervention in order to improve the following deficits and impairments:   Cognitive communication deficit    Problem List There are no problems to display for this patient.  Mammie Meras B. Dreama Saa M.S., CCC-SLP, [facility] Speech-Language Pathologist Rehabilitation Services Office [phone]  Reuel Derby 08/06/2020, 12:03 PM  [place] [facility] MAIN [facility] SERVICES [address] [place], [place], [address] Phone: [phone]   Fax:  [phone]   Name: Barbara Wilkins MRN: [id number] Date of Birth: 1946/02/24

## 2020-08-10 ENCOUNTER — Other Ambulatory Visit: Payer: Self-pay

## 2020-08-10 ENCOUNTER — Ambulatory Visit: Payer: Medicare Other | Admitting: Speech Pathology

## 2020-08-10 DIAGNOSIS — R41841 Cognitive communication deficit: Secondary | ICD-10-CM

## 2020-08-11 ENCOUNTER — Encounter: Payer: Self-pay | Admitting: Speech Pathology

## 2020-08-11 NOTE — Therapy (Signed)
[place] [facility] MAIN [facility] SERVICES [address] [place], [place], [address] Phone: [phone]   Fax:  [phone]  Speech Language Pathology Treatment  Patient Details  Name: Barbara Wilkins MRN: [id number] Date of Birth: [birth year] Referring Provider (SLP): Junious Silk Date: 08/10/2020   End of Session - 08/11/20 0936    Visit Number 9    Number of Visits 25    Date for SLP Re-Evaluation 10/05/20    Authorization Type [employer]    Authorization Time Period 07/12/2020 thru 10/05/2020    Authorization - Visit Number 9    Progress Note Due on Visit 10    SLP Start Time 1100    SLP Stop Time  1220    SLP Time Calculation (min) 80 min    Activity Tolerance Patient tolerated treatment well           History reviewed. No pertinent past medical history.  History reviewed. No pertinent surgical history.  There were no vitals filed for this visit.   Subjective Assessment - 08/11/20 0843    Subjective pt pleasant with increased facial expressions    Patient is accompained by: Family member    Currently in Pain? No/denies                 ADULT SLP TREATMENT - 08/11/20 0001      General Information   Behavior/Cognition Alert;Cooperative;Pleasant mood    HPI Pt is a 73 y/.o. patient of Dr Sherryll Burger who was referral for formal cognitive assessment and cognitive training d/t new diagnosis of dementia. 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 SLUMS done in office today was 10/30." MRI on 05/14/2020, which showed bilateral frontal and hippocampal atrophy, mild white matter microvascular ischemic and  metabolic changes.       Treatment Provided   Treatment provided Cognitive-Linquistic      Pain Assessment   Pain Assessment No/denies pain      Cognitive-Linquistic Treatment   Treatment focused on Cognition;Patient/family/caregiver education    Skilled Treatment Independent with left to right scanning during basic tasks; 1 supervision cue during completion of semi-complex task       Assessment / Recommendations / Plan   Plan Continue with current plan of care      Progression Toward Goals   Progression toward goals Progressing toward goals            SLP Education - 08/11/20 0935    Education Details specific goals that are set for this session    Person(s) Educated Patient;Spouse    Methods Explanation;Demonstration;Verbal cues;Handout    Comprehension Verbalized understanding              SLP Long Term Goals - 07/13/20 1721      SLP LONG TERM GOAL #1   Title Patient  and husband will identify cognitive-communication barriers and participate in developing functional compensatory strategies.    Baseline new goal    Time 12    Period Weeks    Status New    Target Date 10/05/20            Plan - 08/11/20 0945    Clinical Impression Statement Skilled treatment targeted pt's cognitive communication  goals, specifically habituating strategy of starting on the left of the page and going across to the right (left to right). Given pt's education level, pt does not have the innate strategy of left to right that occurs during reading tasks. This deficit interferes with her ability to complete tasks independently. SLP facilitated the session by presenting a basic cancellation task and a dual cancellation task. Pt independent with use of strategy throughout these activities. Therapy further progressed to semi-complex task. Pt only required 1 supervision level cue during this task. Pt made great progress during this session.    Speech Therapy Frequency 2x / week    Duration 12  weeks    Treatment/Interventions Cognitive reorganization;SLP instruction and feedback;Internal/external aids;Cueing hierarchy;Functional tasks;Patient/family education    Potential to Achieve Goals Good    Consulted and Agree with Plan of Care Patient;Family member/caregiver    Family Member Consulted pt's husband           Patient will benefit from skilled therapeutic intervention in order to improve the following deficits and impairments:   Cognitive communication deficit    Problem List There are no problems to display for this patient.  Ailana Cuadrado B. Dreama Saa M.S., CCC-SLP, [facility] Speech-Language Pathologist Rehabilitation Services Office [phone]  Reuel Derby 08/11/2020, 9:46 AM  [place] [facility] MAIN [facility] SERVICES [address] [place], [place], [address] Phone: [phone]   Fax:  [phone]   Name: MILLISA GIARRUSSO MRN: [id number] Date of Birth: [birth year]

## 2020-08-12 ENCOUNTER — Ambulatory Visit: Payer: Medicare Other | Admitting: Speech Pathology

## 2020-08-12 ENCOUNTER — Other Ambulatory Visit: Payer: Self-pay

## 2020-08-12 DIAGNOSIS — R41841 Cognitive communication deficit: Secondary | ICD-10-CM

## 2020-08-12 NOTE — Therapy (Addendum)
[place] [facility] MAIN [facility] SERVICES [address] [place], [place], [address] Phone: [phone]   Fax:  [phone]  Speech Language Pathology Treatment Speech Therapy Progress Note   Dates of reporting period  07/12/2020   to   08/12/2020   Patient Details  Name: Barbara Wilkins MRN: [id number] Date of Birth: 14-Feb-1946 Referring Provider (SLP): Junious Silk Date: 08/12/2020   End of Session - 08/12/20 1806    Visit Number 10    Number of Visits 25    Date for SLP Re-Evaluation 10/05/20    Authorization Type [employer]    Authorization Time Period 07/12/2020 thru 10/05/2020    Authorization - Visit Number 10    Progress Note Due on Visit 10    SLP Start Time 1100    SLP Stop Time  1200    SLP Time Calculation (min) 60 min    Activity Tolerance Patient tolerated treatment well            Subjective Assessment - 08/12/20 1805    Subjective pt pleasant with increased facial expressions    Patient is accompained by: Family member    Currently in Pain? No/denies            ADULT SLP TREATMENT - 08/12/20 0001      General Information   Behavior/Cognition Alert;Cooperative;Pleasant mood    HPI Pt is a 73 y/.o. patient of Dr Sherryll Burger who was referral for formal cognitive assessment and cognitive training d/t new diagnosis of dementia. 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 SLUMS done in office today was 10/30." MRI on 05/14/2020, which showed bilateral frontal and hippocampal atrophy, mild white matter microvascular ischemic and metabolic changes.       Treatment Provided   Treatment provided  Cognitive-Linquistic   Independent use of processing task from left to right; Independent problem-solving basic number cancellation task to achieve 96% accuracy; education provided to answer pt's questions regarding current neurological diagnosis       Pain Assessment   Pain Assessment No/denies pain      Cognitive-Linquistic Treatment   Treatment focused on Cognition;Patient/family/caregiver education      Assessment / Recommendations / Plan   Plan Continue with current plan of care      Progression Toward Goals   Progression toward goals Progressing toward goals             SLP Education - 08/12/20 1805    Education Details diagnosis of dementia, progression of dementia    Person(s) Educated Patient;Spouse    Methods Explanation    Comprehension Verbalized understanding             SLP Long Term Goals - 08/17/20 1037      SLP LONG TERM GOAL #1   Title Patient  and husband will identify cognitive-communication barriers and participate in developing functional compensatory strategies.    Time 12    Period Weeks    Status On-going    Target Date 10/05/20      SLP LONG TERM GOAL #2   Title Patient will demonstrate functional cognitive-communication skills for independent completion of personal responsibilities and leisure activities.    Baseline new goal  Time 12    Period Weeks    Status New    Target Date 10/05/20            Plan - 08/12/20 1806    Clinical Impression Statement Skilled treatment session targeted pt's cognitive training goals. SLP facilitated session by providing education on pt's current neurological diagnosis. Pt asked very appropriate insightful questions regarding current diagnosis and SLP's information aided pt and her husband in developing plan to modify legal documents to reflect their wishes. Pt benefits from having specific goal written for therapy session. This helps her gauge her ability and it helps her to see that she is successful.  For example, the goal for current session is "Pt will independently use left to right scanning to obtain 90% accuracy during cancellation task with minimal assistance."  An additional goal is also generated to target pt's emotional well-being. "Pt will not get nervous or 'muddled' by pressure to achieve 90% accuracy." This strategy is extremely helpful for pt. Will continue.   10TH NOTE PROGRESS REPORT  Pt has made great progress over the last 10 treatment sessions. As a result, she was able to express confusion regarding medication prescriptions. Pt is also implementing cognitive tasks within her day to provide cognitive stimulation. Skilled ST services continue to be required to target cognitive training in the setting of new diagnosis of dementia.     Speech Therapy Frequency 2x / week    Duration 12 weeks    Treatment/Interventions Cognitive reorganization;SLP instruction and feedback;Internal/external aids;Cueing hierarchy;Functional tasks;Patient/family education    Potential to Achieve Goals Good    SLP Home Exercise Plan provided    Consulted and Agree with Plan of Care Patient;Family member/caregiver    Family Member Consulted pt's husband           Patient will benefit from skilled therapeutic intervention in order to improve the following deficits and impairments:   Cognitive communication deficit    Problem List There are no problems to display for this patient.  Algis Lehenbauer B. Dreama Saa M.S., CCC-SLP, [facility] Speech-Language Pathologist Rehabilitation Services Office [phone]   Reuel Derby 08/12/2020, 6:09 PM  [place] [facility] MAIN [facility] SERVICES [address] [place], [place], [address] Phone: [phone]   Fax:  [phone]   Name: Barbara Wilkins MRN: [id number] Date of Birth: December 29, 1945

## 2020-08-17 ENCOUNTER — Other Ambulatory Visit: Payer: Self-pay

## 2020-08-17 ENCOUNTER — Ambulatory Visit: Payer: Medicare Other | Attending: Neurology | Admitting: Speech Pathology

## 2020-08-17 DIAGNOSIS — R41841 Cognitive communication deficit: Secondary | ICD-10-CM | POA: Diagnosis not present

## 2020-08-18 ENCOUNTER — Encounter: Payer: Self-pay | Admitting: Speech Pathology

## 2020-08-19 ENCOUNTER — Ambulatory Visit: Payer: Medicare Other | Admitting: Speech Pathology

## 2020-08-19 ENCOUNTER — Other Ambulatory Visit: Payer: Self-pay

## 2020-08-19 DIAGNOSIS — R41841 Cognitive communication deficit: Secondary | ICD-10-CM

## 2020-08-19 NOTE — Therapy (Signed)
[facility] MAIN [facility] SERVICES [address] [place], [place], [address] Phone: [phone]   Fax:  [phone]  Speech Language Pathology Treatment  Patient Details  Name: Barbara Wilkins MRN: [id number] Date of Birth: 13-Nov-1946 Referring Provider (SLP): Junious Silk Date: 08/17/2020   End of Session - 08/18/20 1535    Visit Number 11    Number of Visits 25    Date for SLP Re-Evaluation 10/05/20    Authorization Type [employer]    Authorization Time Period 07/12/2020 thru 10/05/2020    Authorization - Visit Number 1    Progress Note Due on Visit 10    SLP Start Time 1100    SLP Stop Time  1200    SLP Time Calculation (min) 60 min    Activity Tolerance Patient tolerated treatment well           History reviewed. No pertinent past medical history.  History reviewed. No pertinent surgical history.  There were no vitals filed for this visit.   Subjective Assessment - 08/18/20 1534    Subjective pt pleasant with increased facial expressions    Patient is accompained by: Family member    Currently in Pain? No/denies                 ADULT SLP TREATMENT - 08/18/20 1534      General Information   Behavior/Cognition Alert;Cooperative;Pleasant mood    HPI Pt is a 73 y/.o. patient of Dr Sherryll Burger who was referral for formal cognitive assessment and cognitive training d/t new diagnosis of dementia. 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 SLUMS done in office today was 10/30." MRI on 05/14/2020, which showed bilateral frontal and hippocampal atrophy, mild white matter microvascular ischemic and  metabolic changes.       Treatment Provided   Treatment provided Cognitive-Linquistic      Pain Assessment   Pain Assessment No/denies pain      Cognitive-Linquistic Treatment   Treatment focused on Cognition;Patient/family/caregiver education    Skilled Treatment Independently read [employer] Reader - Baby Animals Pre-reader; Maximal Assistance to recall information from reading       Assessment / Recommendations / Plan   Plan Continue with current plan of care      Progression Toward Goals   Progression toward goals Progressing toward goals            SLP Education - 08/18/20 1535    Education Details provided on ways to improve recall of information from reading    Person(s) Educated Patient;Spouse    Methods Explanation;Demonstration;Verbal cues    Comprehension Verbalized understanding            SLP Short Term Goals - 08/17/20 1023      SLP SHORT TERM GOAL #1   Title Pt will learn and use 2 compensatory memory strategies that are effective within her daily activities.    Baseline new goal    Time 10    Period --   sessions   Status New      SLP SHORT TERM GOAL #2   Title Pt will use compensatory memory strategies to recall at least 1 event from previous day.    Baseline  new goal    Time 10    Period --   sessions   Status New      SLP SHORT TERM GOAL #3   Title With minimal cues, pt will complete basic cognitive training tasks that target preservation and sitmulation of cognitive functions.    Baseline new goal    Time 10    Period --   sessions   Status New            SLP Long Term Goals - 08/17/20 1037      SLP LONG TERM GOAL #1   Title Patient  and husband will identify cognitive-communication barriers and participate in developing functional compensatory strategies.    Time 12    Period Weeks    Status On-going    Target Date 10/05/20      SLP LONG TERM GOAL #2   Title Patient will demonstrate functional cognitive-communication skills for  independent completion of personal responsibilities and leisure activities.    Baseline new goal    Time 12    Period Weeks    Status New    Target Date 10/05/20            Plan - 08/18/20 1537    Clinical Impression Statement Skilled treatment session targeted pt's cognitive communication goals. SLP facilitated session by providing addressing memory with use of reading. Pt read [employer] Reader Pre-reader level "Baby Animals." Pt able to recall 1 thing that was observation based in a picture which increased to 12 facts with  sentence completion, pairing animals with their corresponding details    Speech Therapy Frequency 2x / week    Duration 12 weeks    Treatment/Interventions Cognitive reorganization;SLP instruction and feedback;Internal/external aids;Cueing hierarchy;Functional tasks;Patient/family education           Patient will benefit from skilled therapeutic intervention in order to improve the following deficits and impairments:   No diagnosis found.    Problem List There are no problems to display for this patient.  Leondre Taul B. Dreama Saa M.S., CCC-SLP, [facility] Speech-Language Pathologist Rehabilitation Services Office [phone]  Reuel Derby 08/19/2020, 7:36 AM  [facility] MAIN [facility] SERVICES [address] [place], [place], [address] Phone: [phone]   Fax:  [phone]   Name: Barbara Wilkins MRN: [id number] Date of Birth: February 09, 1946

## 2020-08-20 ENCOUNTER — Encounter: Payer: Self-pay | Admitting: Speech Pathology

## 2020-08-20 NOTE — Therapy (Signed)
[place] [facility] MAIN [facility] SERVICES [address] [place], [place], [address] Phone: [phone]   Fax:  [phone]  Speech Language Pathology Treatment  Patient Details  Name: Barbara Wilkins MRN: [id number] Date of Birth: 1946-05-29 Referring Provider (SLP): Barbara Wilkins Date: 08/19/2020   End of Session - 08/20/20 1637    Visit Number 12    Number of Visits 25    Date for SLP Re-Evaluation 10/05/20    Authorization Type [employer]    Authorization Time Period 07/12/2020 thru 10/05/2020    Authorization - Visit Number 2    Progress Note Due on Visit 10    SLP Start Time 1100    SLP Stop Time  1200    SLP Time Calculation (min) 60 min    Activity Tolerance Patient tolerated treatment well           History reviewed. No pertinent past medical history.  History reviewed. No pertinent surgical history.  There were no vitals filed for this visit.   Subjective Assessment - 08/20/20 1629    Subjective pt pleasant with increased facial expressions    Patient is accompained by: Family member    Currently in Pain? No/denies                 ADULT SLP TREATMENT - 08/20/20 0001      General Information   Behavior/Cognition Alert;Cooperative;Pleasant mood    HPI Pt is a 73 y/.o. patient of Dr Barbara Wilkins who was referral for formal cognitive assessment and cognitive training d/t new diagnosis of dementia. 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 SLUMS done in office today was 10/30." MRI on 05/14/2020, which showed bilateral frontal and hippocampal atrophy, mild white matter microvascular ischemic and  metabolic changes.       Treatment Provided   Treatment provided Cognitive-Linquistic      Pain Assessment   Pain Assessment No/denies pain      Cognitive-Linquistic Treatment   Treatment focused on Cognition;Patient/family/caregiver education    Skilled Treatment independently recalled 9 animals from book and independently answered 12 of 12 questions about book      Assessment / Recommendations / Plan   Plan Continue with current plan of care      Progression Toward Goals   Progression toward goals Progressing toward goals            SLP Education - 08/20/20 1637    Education Details provided on memory    Person(s) Educated Patient;Spouse    Methods Explanation;Demonstration;Handout    Comprehension Verbalized understanding            SLP Short Term Goals - 08/17/20 1023      SLP SHORT TERM GOAL #1   Title Pt will learn and use 2 compensatory memory strategies that are effective within her daily activities.    Baseline new goal    Time 10    Period --   sessions   Status New      SLP SHORT TERM GOAL #2   Title Pt will use compensatory memory strategies to recall at least 1 event from previous day.    Baseline new goal    Time 10  Period --   sessions   Status New      SLP SHORT TERM GOAL #3   Title With minimal cues, pt will complete basic cognitive training tasks that target preservation and sitmulation of cognitive functions.    Baseline new goal    Time 10    Period --   sessions   Status New            SLP Long Term Goals - 08/17/20 1037      SLP LONG TERM GOAL #1   Title Patient  and husband will identify cognitive-communication barriers and participate in developing functional compensatory strategies.    Time 12    Period Weeks    Status On-going    Target Date 10/05/20      SLP LONG TERM GOAL #2   Title Patient will demonstrate functional cognitive-communication skills for independent completion of personal responsibilities and leisure  activities.    Baseline new goal    Time 12    Period Weeks    Status New    Target Date 10/05/20            Plan - 08/20/20 1637    Clinical Impression Statement Skilled treatment session specifically targeted pt's memory goals. SLP faciltiated session by providing education and answering pt's questions on how memory works and ways to improve memory. Pt with much improved recall of facts from book.    Speech Therapy Frequency 2x / week    Duration 12 weeks    Treatment/Interventions Cognitive reorganization;SLP instruction and feedback;Internal/external aids;Cueing hierarchy;Functional tasks;Patient/family education    Potential to Achieve Goals Good    Potential Considerations Ability to learn/carryover information    SLP Home Exercise Plan provided    Consulted and Agree with Plan of Care Patient;Family member/caregiver    Family Member Consulted pt's husband           Patient will benefit from skilled therapeutic intervention in order to improve the following deficits and impairments:   Cognitive communication deficit    Problem List There are no problems to display for this patient.  Barbara Wilkins B. Barbara Wilkins M.S., CCC-SLP, [facility] Speech-Language Pathologist Rehabilitation Services Office [phone]  Barbara Wilkins 08/20/2020, 4:46 PM  [place] [facility] MAIN [facility] SERVICES [address] [place], [place], [address] Phone: [phone]   Fax:  [phone]   Name: Barbara Wilkins MRN: [id number] Date of Birth: [birth year]

## 2020-08-24 ENCOUNTER — Other Ambulatory Visit: Payer: Self-pay

## 2020-08-24 ENCOUNTER — Ambulatory Visit: Payer: Medicare Other | Admitting: Speech Pathology

## 2020-08-24 DIAGNOSIS — R41841 Cognitive communication deficit: Secondary | ICD-10-CM | POA: Diagnosis not present

## 2020-08-25 ENCOUNTER — Encounter: Payer: Self-pay | Admitting: Speech Pathology

## 2020-08-26 ENCOUNTER — Ambulatory Visit: Payer: Medicare Other | Admitting: Speech Pathology

## 2020-08-26 ENCOUNTER — Other Ambulatory Visit: Payer: Self-pay

## 2020-08-26 ENCOUNTER — Encounter: Payer: Self-pay | Admitting: Speech Pathology

## 2020-08-26 DIAGNOSIS — R41841 Cognitive communication deficit: Secondary | ICD-10-CM | POA: Diagnosis not present

## 2020-08-26 NOTE — Therapy (Signed)
[place] [facility] MAIN [facility] SERVICES [address] [place], [place], [address] Phone: [phone]   Fax:  [phone]  Speech Language Pathology Treatment  Patient Details  Name: Barbara Wilkins MRN: [id number] Date of Birth: Dec 15, 1945 Referring Provider (SLP): Junious Silk Date: 08/24/2020   End of Session - 08/26/20 0934    Visit Number 13    Number of Visits 25    Date for SLP Re-Evaluation 10/05/20    Authorization Type [employer]    Authorization Time Period 07/12/2020 thru 10/05/2020    Authorization - Visit Number 3    Progress Note Due on Visit 10    SLP Start Time 1100    SLP Stop Time  1220    SLP Time Calculation (min) 80 min    Activity Tolerance Patient tolerated treatment well            Subjective Assessment - 08/25/20 1914    Subjective pt pleasant with increased facial expressions    Patient is accompained by: Family member    Currently in Pain? No/denies                 ADULT SLP TREATMENT - 08/26/20 0001      General Information   Behavior/Cognition Alert;Cooperative;Pleasant mood    HPI Pt is a 73 y/.o. patient of Dr Sherryll Burger who was referral for formal cognitive assessment and cognitive training d/t new diagnosis of dementia. 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 SLUMS done in office today was 10/30." MRI on 05/14/2020, which showed bilateral frontal and hippocampal atrophy, mild white matter microvascular ischemic and metabolic changes.       Treatment Provided   Treatment provided Cognitive-Linquistic      Pain Assessment   Pain Assessment No/denies pain       Cognitive-Linquistic Treatment   Treatment focused on Cognition;Patient/family/caregiver education    Skilled Treatment Independently read book using left to right, independently used left to right during complex task with 88% accuracy; with minimal cues pt answered problem solving questions during a complex task with 87% accuracy       Assessment / Recommendations / Plan   Plan Continue with current plan of care      Progression Toward Goals   Progression toward goals Progressing toward goals            SLP Education - 08/26/20 0933    Education Details goals for this session    Person(s) Educated Patient    Methods Explanation;Demonstration    Comprehension Verbalized understanding            SLP Short Term Goals - 08/17/20 1023      SLP SHORT TERM GOAL #1   Title Pt will learn and use 2 compensatory memory strategies that are effective within her daily activities.    Baseline new goal    Time 10    Period --   sessions   Status New      SLP SHORT TERM GOAL #2   Title Pt will use compensatory memory strategies to recall at least 1 event from previous day.    Baseline new goal    Time 10    Period --  sessions   Status New      SLP SHORT TERM GOAL #3   Title With minimal cues, pt will complete basic cognitive training tasks that target preservation and sitmulation of cognitive functions.    Baseline new goal    Time 10    Period --   sessions   Status New            SLP Long Term Goals - 08/17/20 1037      SLP LONG TERM GOAL #1   Title Patient  and husband will identify cognitive-communication barriers and participate in developing functional compensatory strategies.    Time 12    Period Weeks    Status On-going    Target Date 10/05/20      SLP LONG TERM GOAL #2   Title Patient will demonstrate functional cognitive-communication skills for independent completion of personal responsibilities and leisure activities.    Baseline new goal    Time 12     Period Weeks    Status New    Target Date 10/05/20            Plan - 08/26/20 0934    Clinical Impression Statement Skilled treatment session focused on pt's cognitive communication goals. SLP facilitated session by presenting varying tasks for pt to use left to right pattern as well as pt answering problem solving questions during a novel card game.    Speech Therapy Frequency 2x / week    Duration 12 weeks    Treatment/Interventions Cognitive reorganization;SLP instruction and feedback;Internal/external aids;Cueing hierarchy;Functional tasks;Patient/family education    Potential to Achieve Goals Good    SLP Home Exercise Plan provided    Consulted and Agree with Plan of Care Patient;Family member/caregiver    Family Member Consulted pt's husband           Patient will benefit from skilled therapeutic intervention in order to improve the following deficits and impairments:   Cognitive communication deficit    Problem List There are no problems to display for this patient.   Barbara Wilkins 08/26/2020, 9:36 AM  [place] [facility] MAIN [facility] SERVICES [address] [place], [place], [address] Phone: [phone]   Fax:  [phone]   Name: Barbara Wilkins MRN: [id number] Date of Birth: 12-04-1945

## 2020-08-26 NOTE — Therapy (Signed)
[place] [facility] MAIN [place] SERVICES [address] [place], [place], [address] Phone: [phone]   Fax:  [phone]  Speech Language Pathology Treatment  Patient Details  Name: Barbara Wilkins MRN: [id number] Date of Birth: [birth year] Referring Provider (SLP): Junious Silk Date: 08/26/2020   End of Session - 08/26/20 1556    Visit Number 14    Number of Visits 25    Date for SLP Re-Evaluation 10/05/20    Authorization Type [employer]    Authorization Time Period 07/12/2020 thru 10/05/2020    Authorization - Visit Number 4    Progress Note Due on Visit 10    SLP Start Time 1100    SLP Stop Time  1200    SLP Time Calculation (min) 60 min    Activity Tolerance Patient tolerated treatment well           History reviewed. No pertinent past medical history.  History reviewed. No pertinent surgical history.  There were no vitals filed for this visit.   Subjective Assessment - 08/26/20 1553    Subjective pt pleasant with increased facial expressions, appears more confident    Patient is accompained by: Family member    Currently in Pain? No/denies                 ADULT SLP TREATMENT - 08/26/20 1553      General Information   Behavior/Cognition Alert;Cooperative;Pleasant mood    HPI Pt is a 73 y/.o. patient of Dr Sherryll Burger who was referral for formal cognitive assessment and cognitive training d/t new diagnosis of dementia. 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 SLUMS done in office today was 10/30." MRI on 05/14/2020, which showed bilateral frontal and hippocampal atrophy, mild white matter  microvascular ischemic and metabolic changes.       Treatment Provided   Treatment provided Cognitive-Linquistic      Pain Assessment   Pain Assessment No/denies pain      Cognitive-Linquistic Treatment   Treatment focused on Cognition;Patient/family/caregiver education    Skilled Treatment Independent with left to right pattern of problem solving; independently answered 96% of questions related to book, 92% accuracy with answering questions during complex task, 93% in recalling basic steps in tasks      Assessment / Recommendations / Plan   Plan Continue with current plan of care      Progression Toward Goals   Progression toward goals Progressing toward goals            SLP Education - 08/26/20 1555    Education Details goals for session, improved recall    Person(s) Educated Patient;Spouse    Methods Explanation;Demonstration    Comprehension Verbalized understanding;Returned demonstration            SLP Short Term Goals - 08/17/20 1023      SLP SHORT TERM GOAL #1   Title Pt will learn and use 2 compensatory memory strategies that are effective within her daily activities.    Baseline new goal    Time 10    Period --   sessions   Status New      SLP SHORT TERM GOAL #2   Title Pt will use compensatory memory  strategies to recall at least 1 event from previous day.    Baseline new goal    Time 10    Period --   sessions   Status New      SLP SHORT TERM GOAL #3   Title With minimal cues, pt will complete basic cognitive training tasks that target preservation and sitmulation of cognitive functions.    Baseline new goal    Time 10    Period --   sessions   Status New            SLP Long Term Goals - 08/17/20 1037      SLP LONG TERM GOAL #1   Title Patient  and husband will identify cognitive-communication barriers and participate in developing functional compensatory strategies.    Time 12    Period Weeks    Status On-going    Target Date 10/05/20        SLP LONG TERM GOAL #2   Title Patient will demonstrate functional cognitive-communication skills for independent completion of personal responsibilities and leisure activities.    Baseline new goal    Time 12    Period Weeks    Status New    Target Date 10/05/20            Plan - 08/26/20 1556    Clinical Impression Statement Skilled treatment session targeted cognitive training. SLP facilitated session by providing delayed recall activity related to previously read book. Pt demonstrated great retrieval of information by independently completing recall task with 96% accy. Pt is also habituating reading and other tasks by starting on the left and working across to the right. SLP further facilitated session by providing simple questions during complex task. Pt achieved 92% accy and was 93% accurate with recall of sequences within task. Pt continues to demonstrate progress.    Speech Therapy Frequency 2x / week    Duration 12 weeks    Treatment/Interventions Cognitive reorganization;SLP instruction and feedback;Internal/external aids;Cueing hierarchy;Functional tasks;Patient/family education    Potential to Achieve Goals Good    SLP Home Exercise Plan provided    Consulted and Agree with Plan of Care Patient;Family member/caregiver    Family Member Consulted pt's husband           Patient will benefit from skilled therapeutic intervention in order to improve the following deficits and impairments:   Cognitive communication deficit    Problem List   Tahjae Durr B. Dreama Saa M.S., CCC-SLP, [facility] Speech-Language Pathologist Rehabilitation Services Office [phone]  Reuel Derby 08/26/2020, 4:05 PM  [facility] MAIN [facility] SERVICES [address] [place], [place], [address] Phone: [phone]   Fax:  [phone]   Name: Barbara Wilkins MRN: [id number] Date of Birth: June 22, 1946

## 2020-08-31 ENCOUNTER — Other Ambulatory Visit: Payer: Self-pay

## 2020-08-31 ENCOUNTER — Ambulatory Visit: Payer: Medicare Other | Admitting: Speech Pathology

## 2020-08-31 DIAGNOSIS — R41841 Cognitive communication deficit: Secondary | ICD-10-CM

## 2020-09-01 ENCOUNTER — Encounter: Payer: Self-pay | Admitting: Speech Pathology

## 2020-09-01 NOTE — Therapy (Signed)
[place] [facility] MAIN [facility] SERVICES [address] [place], [place], [address] Phone: [phone]   Fax:  [phone]  Speech Language Pathology Treatment  Patient Details  Name: Barbara Wilkins MRN: [id number] Date of Birth: June 10, 1946 Referring Provider (SLP): Junious Silk Date: 08/31/2020   End of Session - 09/01/20 1618    Visit Number 15    Number of Visits 25    Date for SLP Re-Evaluation 10/05/20    Authorization Type [employer]    Authorization Time Period 07/12/2020 thru 10/05/2020    Authorization - Visit Number 5    Progress Note Due on Visit 10    SLP Start Time 1410    SLP Stop Time  1510    SLP Time Calculation (min) 60 min    Activity Tolerance Patient tolerated treatment well           History reviewed. No pertinent past medical history.  History reviewed. No pertinent surgical history.  There were no vitals filed for this visit.   Subjective Assessment - 09/01/20 1617    Subjective pt appeared tired    Patient is accompained by: Family member    Currently in Pain? No/denies                 ADULT SLP TREATMENT - 09/01/20 0001      General Information   Behavior/Cognition Alert;Cooperative;Pleasant mood    HPI Pt is a 73 y/.o. patient of Dr Sherryll Burger who was referral for formal cognitive assessment and cognitive training d/t new diagnosis of dementia. 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 SLUMS done in office today was 10/30." MRI on 05/14/2020, which showed bilateral frontal and hippocampal atrophy, mild white matter microvascular ischemic and metabolic changes.        Treatment Provided   Treatment provided Cognitive-Linquistic      Pain Assessment   Pain Assessment No/denies pain      Cognitive-Linquistic Treatment   Treatment focused on Cognition;Patient/family/caregiver education    Skilled Treatment Novel book used to facilitate recall; pt able to match animal with their corresponding home, able to name home when given animal name only, pt also demonstrated improved recall information in personal book       Assessment / Recommendations / Plan   Plan Continue with current plan of care      Progression Toward Goals   Progression toward goals Progressing toward goals            SLP Education - 09/01/20 1618    Education Details ways to improve recall    Person(s) Educated Patient;Spouse    Methods Explanation;Demonstration    Comprehension Verbalized understanding;Returned demonstration            SLP Short Term Goals - 08/17/20 1023      SLP SHORT TERM GOAL #1   Title Pt will learn and use 2 compensatory memory strategies that are effective within her daily activities.    Baseline new goal    Time 10    Period --   sessions   Status New      SLP SHORT TERM GOAL #2   Title Pt will use compensatory memory strategies to recall at least  1 event from previous day.    Baseline new goal    Time 10    Period --   sessions   Status New      SLP SHORT TERM GOAL #3   Title With minimal cues, pt will complete basic cognitive training tasks that target preservation and sitmulation of cognitive functions.    Baseline new goal    Time 10    Period --   sessions   Status New            SLP Long Term Goals - 08/17/20 1037      SLP LONG TERM GOAL #1   Title Patient  and husband will identify cognitive-communication barriers and participate in developing functional compensatory strategies.    Time 12    Period Weeks    Status On-going    Target Date 10/05/20      SLP LONG TERM GOAL #2   Title Patient will demonstrate functional  cognitive-communication skills for independent completion of personal responsibilities and leisure activities.    Baseline new goal    Time 12    Period Weeks    Status New    Target Date 10/05/20            Plan - 09/01/20 1619    Clinical Impression Statement Skilled treatment session targeted pt's cognitive communication goals. SLP facilitated session by introducing a novel book, "Animal [employer]" to use for facilitation of memory strategies. Pt with good initial ability in recalling 1 piece of information, naming an animal and its home, matching pictured animal of pictured home and was 87% accurate when naming animals based on their name only. Additionally, pt stated that she really enjoyed reading her personal book. She also spontaneously named 2 characters and 2 things that happened in the story. Pt continues to demonstrate great progress.    Speech Therapy Frequency 2x / week    Duration 12 weeks    Treatment/Interventions Cognitive reorganization;SLP instruction and feedback;Internal/external aids;Cueing hierarchy;Functional tasks;Patient/family education    Potential to Achieve Goals Good    SLP Home Exercise Plan provided    Consulted and Agree with Plan of Care Patient;Family member/caregiver    Family Member Consulted pt's husband           Patient will benefit from skilled therapeutic intervention in order to improve the following deficits and impairments:   Cognitive communication deficit    Problem List There are no problems to display for this patient.  Brigett Estell B. Dreama Saa M.S., CCC-SLP, [facility] Speech-Language Pathologist Rehabilitation Services Office [phone]  Reuel Derby 09/01/2020, 4:20 PM  [place] [facility] MAIN [facility] SERVICES [address] [place], [place], [address] Phone: [phone]   Fax:  [phone]   Name: Barbara Wilkins MRN: [id number] Date of Birth: February 20, 1946

## 2020-09-02 ENCOUNTER — Ambulatory Visit: Payer: Medicare Other | Admitting: Speech Pathology

## 2020-09-02 ENCOUNTER — Other Ambulatory Visit: Payer: Self-pay

## 2020-09-02 DIAGNOSIS — R41841 Cognitive communication deficit: Secondary | ICD-10-CM | POA: Diagnosis not present

## 2020-09-03 ENCOUNTER — Encounter: Payer: Self-pay | Admitting: Speech Pathology

## 2020-09-03 NOTE — Therapy (Addendum)
[place] [facility] MAIN [facility] SERVICES [address] [place], [place], [address] Phone: [phone]   Fax:  [phone]  Speech Language Pathology Treatment  Patient Details  Name: Barbara Wilkins MRN: [id number] Date of Birth: Sep 05, 1946 Referring Provider (SLP): Junious Silk Date: 09/02/2020   End of Session - 09/03/20 1614    Visit Number 16    Number of Visits 25    Date for SLP Re-Evaluation 10/05/20    Authorization Type [employer]    Authorization Time Period 07/12/2020 thru 10/05/2020    Authorization - Visit Number 6    Progress Note Due on Visit 10    SLP Start Time 1400    SLP Stop Time  1530    SLP Time Calculation (min) 90 min    Activity Tolerance Patient tolerated treatment well           History reviewed. No pertinent past medical history.  History reviewed. No pertinent surgical history.  There were no vitals filed for this visit.   Subjective Assessment - 09/03/20 1613    Subjective pt pleasant, conversant    Patient is accompained by: Family member    Currently in Pain? No/denies                 ADULT SLP TREATMENT - 09/03/20 0001      General Information   Behavior/Cognition Alert;Cooperative;Pleasant mood    HPI Pt is a 73 y/.o. patient of Dr Sherryll Burger who was referral for formal cognitive assessment and cognitive training d/t new diagnosis of dementia. 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 SLUMS done in office today was 10/30." MRI on 05/14/2020, which showed bilateral frontal and hippocampal atrophy, mild white matter microvascular ischemic and metabolic changes.         Treatment Provided   Treatment provided Cognitive-Linquistic      Pain Assessment   Pain Assessment No/denies pain      Cognitive-Linquistic Treatment   Treatment focused on Cognition;Patient/family/caregiver education    Skilled Treatment Pt independently recalled 6 of 9 animals, answered basic and semi-complex questions, scanned left to right in 3 of 4 opportunities and answered problem solving questions with 75% accuracy      Assessment / Recommendations / Plan   Plan Continue with current plan of care      Progression Toward Goals   Progression toward goals Progressing toward goals            SLP Education - 09/03/20 1614    Education Details activities to improve recall    Person(s) Educated Patient;Spouse    Methods Explanation;Demonstration    Comprehension Verbalized understanding;Returned demonstration            SLP Short Term Goals - 08/17/20 1023      SLP SHORT TERM GOAL #1   Title Pt will learn and use 2 compensatory memory strategies that are effective within her daily activities.    Baseline new goal    Time 10    Period --   sessions   Status New      SLP SHORT TERM GOAL #2   Title Pt will use compensatory memory strategies to recall at least 1 event from previous  day.    Baseline new goal    Time 10    Period --   sessions   Status New      SLP SHORT TERM GOAL #3   Title With minimal cues, pt will complete basic cognitive training tasks that target preservation and sitmulation of cognitive functions.    Baseline new goal    Time 10    Period --   sessions   Status New            SLP Long Term Goals - 08/17/20 1037      SLP LONG TERM GOAL #1   Title Patient  and husband will identify cognitive-communication barriers and participate in developing functional compensatory strategies.    Time 12    Period Weeks    Status On-going    Target Date 10/05/20      SLP LONG TERM GOAL #2   Title Patient will demonstrate functional  cognitive-communication skills for independent completion of personal responsibilities and leisure activities.    Baseline new goal    Time 12    Period Weeks    Status New    Target Date 10/05/20            Plan - 09/03/20 1615    Clinical Impression Statement Skilled treatment session targeted pt's cognitive communication goals. SLP facilitated session by providing recall activities related to novel book [employer]). Pt demonstrates improved recall by independently listing 6 of 9 animals and answering basic to semi-complex questions with 100% accuracy. SLP further facilitated session by providing worksheets to increase recall for homework. Pt with improved accuracy when answering problem solving questions related to known game.     Speech Therapy Frequency 2x / week    Duration 12 weeks    Treatment/Interventions Cognitive reorganization;SLP instruction and feedback;Internal/external aids;Cueing hierarchy;Functional tasks;Patient/family education    Potential to Achieve Goals Good    Potential Considerations Ability to learn/carryover information    SLP Home Exercise Plan provided    Consulted and Agree with Plan of Care Patient;Family member/caregiver    Family Member Consulted pt's husband           Patient will benefit from skilled therapeutic intervention in order to improve the following deficits and impairments:   Cognitive communication deficit    Problem List There are no problems to display for this patient.  Ozro Russett B. Dreama Saa M.S., CCC-SLP, [facility] Speech-Language Pathologist Rehabilitation Services Office [phone]  Reuel Derby 09/03/2020, 4:16 PM  [facility] MAIN [facility] SERVICES [address] [place], [place], [address] Phone: [phone]   Fax:  [phone]   Name: ISMAHAN LIPPMAN MRN: [id number] Date of Birth: [birth year]

## 2020-09-07 ENCOUNTER — Other Ambulatory Visit: Payer: Self-pay

## 2020-09-07 ENCOUNTER — Ambulatory Visit: Payer: Medicare Other | Admitting: Speech Pathology

## 2020-09-07 DIAGNOSIS — R41841 Cognitive communication deficit: Secondary | ICD-10-CM | POA: Diagnosis not present

## 2020-09-08 ENCOUNTER — Encounter: Payer: Self-pay | Admitting: Speech Pathology

## 2020-09-08 NOTE — Therapy (Signed)
[facility] MAIN [facility] SERVICES [address] [place], [place], [address] Phone: [phone]   Fax:  [phone]  Speech Language Pathology Treatment  Patient Details  Name: Barbara Wilkins MRN: [id number] Date of Birth: 1946/05/13 Referring Provider (SLP): Junious Silk Date: 09/07/2020   End of Session - 09/08/20 2107    Visit Number 17    Number of Visits 25    Date for SLP Re-Evaluation 10/05/20    Authorization Type [employer]    Authorization Time Period 07/12/2020 thru 10/05/2020    Authorization - Visit Number 7    Progress Note Due on Visit 10    SLP Start Time 1400    SLP Stop Time  1500    SLP Time Calculation (min) 60 min    Activity Tolerance Patient tolerated treatment well           History reviewed. No pertinent past medical history.  History reviewed. No pertinent surgical history.  There were no vitals filed for this visit.   Subjective Assessment - 09/08/20 2056    Subjective pt pleasant, conversant    Patient is accompained by: Family member    Currently in Pain? No/denies                 ADULT SLP TREATMENT - 09/08/20 0001      General Information   Behavior/Cognition Alert;Cooperative;Pleasant mood    HPI Pt is a 73 y/.o. patient of Dr Sherryll Burger who was referral for formal cognitive assessment and cognitive training d/t new diagnosis of dementia. 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 SLUMS done in office today was 10/30." MRI on 05/14/2020, which showed bilateral frontal and hippocampal atrophy, mild white matter microvascular ischemic and metabolic changes.         Treatment Provided   Treatment provided Cognitive-Linquistic      Pain Assessment   Pain Assessment No/denies pain      Cognitive-Linquistic Treatment   Treatment focused on Cognition;Patient/family/caregiver education    Skilled Treatment Independently answered semi-complex questions with 84% accuracy targeting recall of information in prior 2 books, 100% accuracy with left to right processing, recalled rules to complex task with 79% accuracy with moderate assistance      Assessment / Recommendations / Plan   Plan Continue with current plan of care      Progression Toward Goals   Progression toward goals Progressing toward goals            SLP Education - 09/08/20 2107    Education Details activities to improve recall/memory    Person(s) Educated Patient;Spouse    Methods Explanation;Demonstration    Comprehension Verbalized understanding            SLP Short Term Goals - 08/17/20 1023      SLP SHORT TERM GOAL #1   Title Pt will learn and use 2 compensatory memory strategies that are effective within her daily activities.    Baseline new goal    Time 10    Period --   sessions   Status New      SLP SHORT TERM GOAL #2   Title Pt will use compensatory memory strategies to recall at least 1  event from previous day.    Baseline new goal    Time 10    Period --   sessions   Status New      SLP SHORT TERM GOAL #3   Title With minimal cues, pt will complete basic cognitive training tasks that target preservation and sitmulation of cognitive functions.    Baseline new goal    Time 10    Period --   sessions   Status New            SLP Long Term Goals - 08/17/20 1037      SLP LONG TERM GOAL #1   Title Patient  and husband will identify cognitive-communication barriers and participate in developing functional compensatory strategies.    Time 12    Period Weeks    Status On-going    Target Date 10/05/20      SLP LONG TERM GOAL #2   Title Patient will  demonstrate functional cognitive-communication skills for independent completion of personal responsibilities and leisure activities.    Baseline new goal    Time 12    Period Weeks    Status New    Target Date 10/05/20            Plan - 09/08/20 2108    Clinical Impression Statement Skilled treatment session targeted pt's cognitive training. SLP facilitated session by providing semi-complex questions targeting previous 2 books. Pt independently answered with 84% accuracy. In addition, pt utilized left to right scanning in 100% of opportunities and with moderate cues pt recall rules within known complex tasks.    Speech Therapy Frequency 2x / week    Duration 12 weeks    Treatment/Interventions Cognitive reorganization;SLP instruction and feedback;Internal/external aids;Cueing hierarchy;Functional tasks;Patient/family education    Potential to Achieve Goals Good    Potential Considerations Ability to learn/carryover information    SLP Home Exercise Plan answer worksheet related to personal book    Consulted and Agree with Plan of Care Patient;Family member/caregiver    Family Member Consulted pt's husband           Patient will benefit from skilled therapeutic intervention in order to improve the following deficits and impairments:   Cognitive communication deficit    Problem List There are no problems to display for this patient.  Katelan Hirt B. Dreama Saa M.S., CCC-SLP, [facility] Speech-Language Pathologist Rehabilitation Services Office [phone]  Reuel Derby 09/08/2020, 9:13 PM  [facility] MAIN [facility] SERVICES [address] [place], [place], [address] Phone: [phone]   Fax:  [phone]   Name: Barbara Wilkins MRN: [id number] Date of Birth: June 11, 1946

## 2020-09-09 ENCOUNTER — Ambulatory Visit: Payer: Medicare Other | Admitting: Speech Pathology

## 2020-09-09 ENCOUNTER — Other Ambulatory Visit: Payer: Self-pay

## 2020-09-09 ENCOUNTER — Encounter: Payer: Self-pay | Admitting: Speech Pathology

## 2020-09-09 DIAGNOSIS — R41841 Cognitive communication deficit: Secondary | ICD-10-CM

## 2020-09-09 NOTE — Therapy (Signed)
[place] [facility] MAIN [facility] SERVICES [address] [place], [place], [address] Phone: [phone]   Fax:  [phone]  Speech Language Pathology Treatment  Patient Details  Name: Barbara Wilkins MRN: [id number] Date of Birth: 1946-05-11 Referring Provider (SLP): Junious Silk Date: 09/09/2020   End of Session - 09/09/20 2222    Visit Number 18    Number of Visits 25    Date for SLP Re-Evaluation 10/05/20    Authorization Type [employer]    Authorization Time Period 07/12/2020 thru 10/05/2020    Authorization - Visit Number 8    Progress Note Due on Visit 10    SLP Start Time 1400    SLP Stop Time  1500    SLP Time Calculation (min) 60 min    Activity Tolerance Patient tolerated treatment well           History reviewed. No pertinent past medical history.  History reviewed. No pertinent surgical history.  There were no vitals filed for this visit.   Subjective Assessment - 09/09/20 2220    Subjective pt pleasant, conversant    Patient is accompained by: Family member    Currently in Pain? No/denies                 ADULT SLP TREATMENT - 09/09/20 0001      General Information   Behavior/Cognition Alert;Cooperative;Pleasant mood    HPI Pt is a 73 y/.o. patient of Dr Sherryll Burger who was referral for formal cognitive assessment and cognitive training d/t new diagnosis of dementia. 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 SLUMS done in office today was 10/30." MRI on 05/14/2020, which showed bilateral frontal and hippocampal atrophy, mild white matter microvascular ischemic and metabolic changes.         Treatment Provided   Treatment provided Cognitive-Linquistic      Pain Assessment   Pain Assessment No/denies pain      Cognitive-Linquistic Treatment   Treatment focused on Cognition;Patient/family/caregiver education    Skilled Treatment Maximal assistance to recall immediate information after reading text, cues faded to minimal to recall information to asnswer basic questions about text.       Assessment / Recommendations / Plan   Plan Continue with current plan of care      Progression Toward Goals   Progression toward goals Progressing toward goals            SLP Education - 09/09/20 2222    Education Details provided    Person(s) Educated Patient;Spouse            SLP Short Term Goals - 08/17/20 1023      SLP SHORT TERM GOAL #1   Title Pt will learn and use 2 compensatory memory strategies that are effective within her daily activities.    Baseline new goal    Time 10    Period --   sessions   Status New      SLP SHORT TERM GOAL #2   Title Pt will use compensatory memory strategies to recall at least 1 event from previous day.    Baseline new goal    Time 10    Period --   sessions  Status New      SLP SHORT TERM GOAL #3   Title With minimal cues, pt will complete basic cognitive training tasks that target preservation and sitmulation of cognitive functions.    Baseline new goal    Time 10    Period --   sessions   Status New            SLP Long Term Goals - 08/17/20 1037      SLP LONG TERM GOAL #1   Title Patient  and husband will identify cognitive-communication barriers and participate in developing functional compensatory strategies.    Time 12    Period Weeks    Status On-going    Target Date 10/05/20      SLP LONG TERM GOAL #2   Title Patient will demonstrate functional cognitive-communication skills for independent completion of personal responsibilities and leisure activities.    Baseline new goal    Time 12    Period Weeks     Status New    Target Date 10/05/20            Plan - 09/09/20 2223    Clinical Impression Statement Skilled treatment session targeted pt's cognitive training. SLP facilitated session by providing maxiaml cues to immediately recall 2 pieces of information about written text. SLP faded cues to moderate to aid in recall of information to asnwer basic questions about text. SLP further facilitated session by providing examples of how to cue pt effectively to help with recall of information.    Speech Therapy Frequency 2x / week    Duration 12 weeks    Treatment/Interventions Cognitive reorganization;SLP instruction and feedback;Internal/external aids;Cueing hierarchy;Functional tasks;Patient/family education    Potential to Achieve Goals Good    SLP Home Exercise Plan answer worksheet related to personal book    Consulted and Agree with Plan of Care Patient;Family member/caregiver    Family Member Consulted pt's husband           Patient will benefit from skilled therapeutic intervention in order to improve the following deficits and impairments:   Cognitive communication deficit    Problem List There are no problems to display for this patient.  Vernon Maish B. Dreama Saa M.S., CCC-SLP, [facility] Speech-Language Pathologist Rehabilitation Services Office [phone]  Reuel Derby 09/09/2020, 10:28 PM  [place] [facility] MAIN [facility] SERVICES [address] [place], [place], [address] Phone: [phone]   Fax:  [phone]   Name: Barbara Wilkins MRN: [id number] Date of Birth: 1946-04-26

## 2020-09-16 ENCOUNTER — Encounter: Payer: Self-pay | Admitting: Speech Pathology

## 2020-09-16 ENCOUNTER — Ambulatory Visit: Payer: Medicare Other | Attending: Neurology | Admitting: Speech Pathology

## 2020-09-16 ENCOUNTER — Other Ambulatory Visit: Payer: Self-pay

## 2020-09-16 DIAGNOSIS — R41841 Cognitive communication deficit: Secondary | ICD-10-CM

## 2020-09-17 NOTE — Therapy (Signed)
[facility] MAIN [facility] SERVICES [address] [place], [place], [address] Phone: [phone]   Fax:  [phone]  Speech Language Pathology Treatment  Patient Details  Name: Barbara Wilkins MRN: [id number] Date of Birth: 10/23/1946 Referring Provider (SLP): Junious Silk Date: 09/16/2020   End of Session - 09/17/20 1455    Visit Number 19    Number of Visits 25    Date for SLP Re-Evaluation 10/05/20    Authorization Type [employer]    Authorization Time Period 07/12/2020 thru 10/05/2020    Authorization - Visit Number 9    Progress Note Due on Visit 10    SLP Start Time 1100    SLP Stop Time  1200    SLP Time Calculation (min) 60 min    Activity Tolerance Patient limited by pain           History reviewed. No pertinent past medical history.  History reviewed. No pertinent surgical history.  There were no vitals filed for this visit.   Subjective Assessment - 09/16/20 1111    Subjective doesn't feel well, chronic headaches/nausea    Patient is accompained by: Family member    Currently in Pain? No/denies                 ADULT SLP TREATMENT - 09/17/20 0001      General Information   Behavior/Cognition Alert;Cooperative;Pleasant mood    HPI Pt is a 73 y/.o. patient of Dr Sherryll Burger who was referral for formal cognitive assessment and cognitive training d/t new diagnosis of dementia. 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 SLUMS done in office today was 10/30." MRI on 05/14/2020, which showed bilateral frontal and hippocampal atrophy, mild white matter microvascular ischemic and metabolic  changes.       Treatment Provided   Treatment provided Cognitive-Linquistic      Pain Assessment   Pain Assessment 0-10    Pain Score 8     Pain Location headache    Pain Descriptors / Indicators Aching;Constant    Pain Intervention(s) Limited activity within patient's tolerance;Monitored during session      Cognitive-Linquistic Treatment   Treatment focused on Cognition;Patient/family/caregiver education    Skilled Treatment Session limited by pt's headache and nausea. Session focused on education towards goals with visual information provided.       Assessment / Recommendations / Plan   Plan Continue with current plan of care      Progression Toward Goals   Progression toward goals Progressing toward goals            SLP Education - 09/17/20 1454    Education Details recommend making an appointment with pt's neurologist for migraine management    Person(s) Educated Patient;Spouse    Methods Explanation    Comprehension Verbalized understanding            SLP Short Term Goals - 08/17/20 1023      SLP SHORT TERM GOAL #1   Title Pt will learn and use 2 compensatory memory strategies that are effective within her daily activities.    Baseline new goal    Time 10    Period --   sessions   Status New  SLP SHORT TERM GOAL #2   Title Pt will use compensatory memory strategies to recall at least 1 event from previous day.    Baseline new goal    Time 10    Period --   sessions   Status New      SLP SHORT TERM GOAL #3   Title With minimal cues, pt will complete basic cognitive training tasks that target preservation and sitmulation of cognitive functions.    Baseline new goal    Time 10    Period --   sessions   Status New            SLP Long Term Goals - 08/17/20 1037      SLP LONG TERM GOAL #1   Title Patient  and husband will identify cognitive-communication barriers and participate in developing functional compensatory strategies.    Time 12     Period Weeks    Status On-going    Target Date 10/05/20      SLP LONG TERM GOAL #2   Title Patient will demonstrate functional cognitive-communication skills for independent completion of personal responsibilities and leisure activities.    Baseline new goal    Time 12    Period Weeks    Status New    Target Date 10/05/20            Plan - 09/17/20 1458    Clinical Impression Statement Skilled treatment session focused on helping pt recall information regarding her chronic migraines in preparation for possible appt with neurologist.    Speech Therapy Frequency 2x / week    Duration 12 weeks    Treatment/Interventions Cognitive reorganization;SLP instruction and feedback;Internal/external aids;Cueing hierarchy;Functional tasks;Patient/family education    Potential to Achieve Goals Good    SLP Home Exercise Plan [facility] an appt with neurologist for migraine management    Consulted and Agree with Plan of Care Patient;Family member/caregiver    Family Member Consulted pt's husband           Patient will benefit from skilled therapeutic intervention in order to improve the following deficits and impairments:   Cognitive communication deficit    Problem List There are no problems to display for this patient.  Balbina Depace B. Dreama Saa M.S., CCC-SLP, [facility] Speech-Language Pathologist Rehabilitation Services Office [phone]  Reuel Derby 09/17/2020, 3:06 PM  [place] [facility] MAIN [facility] SERVICES [address] [place], [place], [address] Phone: [phone]   Fax:  [phone]   Name: Barbara Wilkins MRN: [id number] Date of Birth: Apr 04, 1946

## 2020-09-21 ENCOUNTER — Other Ambulatory Visit: Payer: Self-pay

## 2020-09-21 ENCOUNTER — Ambulatory Visit: Payer: Medicare Other | Admitting: Speech Pathology

## 2020-09-21 DIAGNOSIS — R41841 Cognitive communication deficit: Secondary | ICD-10-CM

## 2020-09-23 ENCOUNTER — Ambulatory Visit: Payer: Medicare Other | Admitting: Speech Pathology

## 2020-09-23 ENCOUNTER — Other Ambulatory Visit: Payer: Self-pay

## 2020-09-23 DIAGNOSIS — R41841 Cognitive communication deficit: Secondary | ICD-10-CM

## 2020-09-23 NOTE — Therapy (Signed)
[place] [facility] MAIN REHAB SERVICES [address] [place], [place], [address] Phone: [phone]   Fax:  [phone]  Speech Language Pathology Treatment/ 10 TH SESSION PROGRESS NOTE    Dates of reporting period  08/12/2020   to   09/21/2020  Patient Details  Name: Barbara Wilkins MRN: [id number] Date of Birth: 07/11/1946 Referring Provider (SLP): Shah   Encounter Date: 09/21/2020   End of Session - 09/22/20 1632    Visit Number 20    Number of Visits 25    Date for SLP Re-Evaluation 10/05/20    Authorization Type [employer]    Authorization Time Period 07/12/2020 thru 10/05/2020    Authorization - Visit Number 10    Progress Note Due on Visit 10    SLP Start Time 1330    SLP Stop Time  1430    SLP Time Calculation (min) 60 min    Activity Tolerance Patient limited by pain           No past medical history on file.  No past surgical history on file.  There were no vitals filed for this visit.   Subjective Assessment - 09/22/20 1630    Subjective doesn't feel well, chronic headaches/nausea    Patient is accompained by: Family member    Currently in Pain? Yes    Pain Score 10-Worst pain ever    Pain Location Head                 ADULT SLP TREATMENT - 09/23/20 0001      General Information   Behavior/Cognition Alert;Cooperative;Pleasant mood    HPI Pt is a 73 y/.o. patient of Dr Shah who was referral for formal cognitive assessment and cognitive training d/t new diagnosis of dementia. 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 SLUMS done in office today was 10/30." MRI on 05/14/2020,  which showed bilateral frontal and hippocampal atrophy, mild white matter microvascular ischemic and metabolic changes.       Treatment Provided   Treatment provided Cognitive-Linquistic      Pain Assessment   Pain Assessment 0-10    Pain Score 10-Worst pain ever    Pain Location headache    Pain Descriptors / Indicators Aching;Constant    Pain Intervention(s) Limited activity within patient's tolerance      Cognitive-Linquistic Treatment   Treatment focused on Cognition;Patient/family/caregiver education    Skilled Treatment Session limited by pt's migraine. Educaiton provided on diagnosis of dementia and prognosis      Assessment / Recommendations / Plan   Plan Continue with current plan of care      Progression Toward Goals   Progression toward goals Progressing toward goals            SLP Education - 09/22/20 1631    Education Details dementia    Person(s) Educated Patient;Spouse            SLP Short Term Goals - 09/23/20 1438      SLP SHORT TERM GOAL #1   Status Achieved      SLP SHORT TERM GOAL #2   Status Achieved      SLP SHORT TERM GOAL #3   Status Achieved        SLP SHORT TERM GOAL #4   Title With minimal cues, pt will complete semi-complex cognitive training tasks that target preservation and stmulation of cognitive functions.    Baseline Minimal assistance with basic cognitive tasks    Time 10    Period --   sessions   Status New      SLP SHORT TERM GOAL #5   Title Pt will demonstrate increased understanding of dementia by answering basic questions with 90% accuracy.    Baseline new goal    Time 10    Period --   sessions   Status New      Additional Short Term Goals   Additional Short Term Goals Yes      SLP SHORT TERM GOAL #6   Title Pt will demonstrate recall of basic novel information with 80% accuracy and minimal cues.    Baseline new goal    Time 10    Period --   sessions   Status New            SLP Long Term Goals - 09/23/20  1444      SLP LONG TERM GOAL #1   Title Patient  and husband will identify cognitive-communication barriers and participate in developing functional compensatory strategies.    Status On-going      SLP LONG TERM GOAL #2   Title Patient will demonstrate functional cognitive-communication skills for independent completion of personal responsibilities and leisure activities.    Status On-going            Plan - 09/23/20 1447    Clinical Impression Statement Skilled treatment session focused on providing education pertaining to dementia in order help pt's understanding of diagnosis. All questions answered to pt and husband's satisfaction.   10 TH VISIT PROGRESS Pt has made good progress in skilled ST sessions and as a result she has met 3 of 3 STGs and is making progress towards her LTGs. Skilled ST continues to be medically necessary to continue increasing pt's functional independence and reduce caregiver burden.    Speech Therapy Frequency 2x / week    Duration 12 weeks    Treatment/Interventions Cognitive reorganization;SLP instruction and feedback;Internal/external aids;Cueing hierarchy;Functional tasks;Patient/family education    Potential to Achieve Goals Good    SLP Home Exercise Plan pt has appt for migraine management Wednesday 11/10    Consulted and Agree with Plan of Care Patient;Family member/caregiver    Family Member Consulted pt's husband           Patient will benefit from skilled therapeutic intervention in order to improve the following deficits and impairments:   Cognitive communication deficit    Problem List There are no problems to display for this patient.  Karin Pinedo B. Rutherford Nail M.S., CCC-SLP, [place] Pathologist Rehabilitation Services Office [phone]  [name] 09/23/2020, 2:50 PM  [place] MAIN [facility] SERVICES [address] [place], [place], [address] Phone: [phone]   Fax:   [phone]   Name: Barbara Wilkins MRN: [id number] Date of Birth: 1945-12-01

## 2020-09-24 ENCOUNTER — Other Ambulatory Visit (HOSPITAL_COMMUNITY): Payer: Self-pay | Admitting: Neurology

## 2020-09-24 ENCOUNTER — Other Ambulatory Visit: Payer: Self-pay | Admitting: Neurology

## 2020-09-24 ENCOUNTER — Encounter: Payer: Self-pay | Admitting: Speech Pathology

## 2020-09-24 DIAGNOSIS — Z8669 Personal history of other diseases of the nervous system and sense organs: Secondary | ICD-10-CM

## 2020-09-24 NOTE — Therapy (Signed)
[facility] MAIN [facility] SERVICES [address] [place], [place], [address] Phone: [phone]   Fax:  [phone]  Speech Language Pathology Treatment  Patient Details  Name: Barbara Wilkins MRN: [id number] Date of Birth: January 09, 1946 Referring Provider (SLP): Junious Silk Date: 09/23/2020   End of Session - 09/24/20 0848    Visit Number 21    Number of Visits 25    Date for SLP Re-Evaluation 10/05/20    Authorization Type [employer]    Authorization Time Period 07/12/2020 thru 10/05/2020    Authorization - Visit Number 1    Progress Note Due on Visit 10    SLP Start Time 1100    SLP Stop Time  1200    SLP Time Calculation (min) 60 min    Activity Tolerance Patient tolerated treatment well           History reviewed. No pertinent past medical history.  History reviewed. No pertinent surgical history.  There were no vitals filed for this visit.   Subjective Assessment - 09/24/20 0846    Subjective feels better, migraine not as severe this morning    Patient is accompained by: Family member    Currently in Pain? Yes    Pain Score 6     Pain Location Head    Pain Descriptors / Indicators Aching;Throbbing    Pain Type Chronic pain    Pain Onset More than a month ago    Pain Frequency Constant                 ADULT SLP TREATMENT - 09/24/20 0001      General Information   Behavior/Cognition Alert;Cooperative;Pleasant mood    HPI Barbara Wilkins is a 73 y/.o. patient of Dr Sherryll Burger who was referral for formal cognitive assessment and cognitive training d/t new diagnosis of dementia. 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 SLUMS done in office today was 10/30." MRI on 05/14/2020, which showed bilateral frontal and hippocampal atrophy, mild white matter microvascular ischemic and metabolic changes.       Treatment Provided   Treatment provided Cognitive-Linquistic      Pain Assessment   Pain Assessment 0-10    Pain Score 6     Pain Location headache    Pain Descriptors / Indicators Aching;Constant    Pain Intervention(s) Monitored during session      Cognitive-Linquistic Treatment   Treatment focused on Cognition;Patient/family/caregiver education    Skilled Treatment Moderate assistance to organize and problem solve basic jigsaw puzzle       Assessment / Recommendations / Plan   Plan Continue with current plan of care      Progression Toward Goals   Progression toward goals Progressing toward goals            SLP Education - 09/24/20 0848    Education Details task organization    Person(s) Educated Patient;Spouse    Methods Explanation    Comprehension Verbalized understanding;Verbal cues required            SLP Short Term Goals - 09/23/20 1438      SLP SHORT TERM GOAL #1   Status Achieved      SLP SHORT TERM  GOAL #2   Status Achieved      SLP SHORT TERM GOAL #3   Status Achieved      SLP SHORT TERM GOAL #4   Title With minimal cues, Barbara Wilkins will complete semi-complex cognitive training tasks that target preservation and stmulation of cognitive functions.    Baseline Minimal assistance with basic cognitive tasks    Time 10    Period --   sessions   Status New      SLP SHORT TERM GOAL #5   Title Barbara Wilkins will demonstrate increased understanding of dementia by answering basic questions with 90% accuracy.    Baseline new goal    Time 10    Period --   sessions   Status New      Additional Short Term Goals   Additional Short Term Goals Yes      SLP SHORT TERM GOAL #6   Title Barbara Wilkins will demonstrate recall of basic novel information with 80% accuracy and minimal cues.     Baseline new goal    Time 10    Period --   sessions   Status New            SLP Long Term Goals - 09/23/20 1444      SLP LONG TERM GOAL #1   Title Patient  and husband will identify cognitive-communication barriers and participate in developing functional compensatory strategies.    Status On-going      SLP LONG TERM GOAL #2   Title Patient will demonstrate functional cognitive-communication skills for independent completion of personal responsibilities and leisure activities.    Status On-going            Plan - 09/24/20 0849    Speech Therapy Frequency 2x / week    Duration 12 weeks    Treatment/Interventions Cognitive reorganization;SLP instruction and feedback;Internal/external aids;Cueing hierarchy;Functional tasks;Patient/family education    Potential to Achieve Goals Good    SLP Home Exercise Plan continue task organization with jigsaw puzzle    Consulted and Agree with Plan of Care Patient;Family member/caregiver    Family Member Consulted Barbara Wilkins's husband           Patient will benefit from skilled therapeutic intervention in order to improve the following deficits and impairments:   Cognitive communication deficit    Problem List There are no problems to display for this patient.  Aubriel Khanna B. Dreama Saa M.S., CCC-SLP, [facility] Speech-Language Pathologist Rehabilitation Services Office [phone]  Reuel Derby 09/24/2020, 8:50 AM  [facility] MAIN [facility] SERVICES [address] [place], [place], [address] Phone: [phone]   Fax:  [phone]   Name: ALLETTA MATTOS MRN: [id number] Date of Birth: 1946-08-25

## 2020-09-28 ENCOUNTER — Other Ambulatory Visit: Payer: Self-pay

## 2020-09-28 ENCOUNTER — Encounter: Payer: Self-pay | Admitting: Speech Pathology

## 2020-09-28 ENCOUNTER — Ambulatory Visit: Payer: Medicare Other | Admitting: Speech Pathology

## 2020-09-28 DIAGNOSIS — R41841 Cognitive communication deficit: Secondary | ICD-10-CM

## 2020-09-28 NOTE — Therapy (Signed)
[facility] MAIN [facility] SERVICES [address] [place], [place], [address] Phone: [phone]   Fax:  [phone]  Speech Language Pathology Treatment  Patient Details  Name: Barbara Wilkins MRN: [id number] Date of Birth: May 31, 1946 Referring Provider (SLP): Junious Silk Date: 09/28/2020   End of Session - 09/28/20 1706    Visit Number 22    Number of Visits 25    Date for SLP Re-Evaluation 10/05/20    Authorization Type [employer]    Authorization Time Period 07/12/2020 thru 10/05/2020    Authorization - Visit Number 2    Progress Note Due on Visit 10    SLP Start Time 1400    SLP Stop Time  1500    SLP Time Calculation (min) 60 min    Activity Tolerance Patient tolerated treatment well           History reviewed. No pertinent past medical history.  History reviewed. No pertinent surgical history.  There were no vitals filed for this visit.   Subjective Assessment - 09/28/20 1703    Subjective pt appeared to feel better, more engaged in tasks    Patient is accompained by: Family member    Currently in Pain? No/denies                 ADULT SLP TREATMENT - 09/28/20 0001      General Information   Behavior/Cognition Alert;Cooperative;Pleasant mood    HPI Pt is a 73 y/.o. patient of Dr Sherryll Burger who was referral for formal cognitive assessment and cognitive training d/t new diagnosis of dementia. 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 SLUMS done in office today was 10/30." MRI on 05/14/2020, which showed bilateral frontal and hippocampal atrophy, mild white matter microvascular ischemic  and metabolic changes.       Treatment Provided   Treatment provided Cognitive-Linquistic      Pain Assessment   Pain Assessment No/denies pain      Cognitive-Linquistic Treatment   Treatment focused on Cognition;Patient/family/caregiver education    Skilled Treatment Minimal faded to supervision for organization and problem solving semi-complex jigsaw puzzle      Assessment / Recommendations / Plan   Plan Continue with current plan of care      Progression Toward Goals   Progression toward goals Progressing toward goals            SLP Education - 09/28/20 1705    Education Details task organization    Person(s) Educated Patient;Spouse    Methods Explanation;Demonstration    Comprehension Verbalized understanding;Verbal cues required            SLP Short Term Goals - 09/23/20 1438      SLP SHORT TERM GOAL #1   Status Achieved      SLP SHORT TERM GOAL #2   Status Achieved      SLP SHORT TERM GOAL #3   Status Achieved      SLP SHORT TERM GOAL #4   Title With minimal cues, pt will complete semi-complex cognitive training tasks that target preservation and stmulation of cognitive functions.    Baseline Minimal assistance with basic cognitive tasks    Time 10    Period --  sessions   Status New      SLP SHORT TERM GOAL #5   Title Pt will demonstrate increased understanding of dementia by answering basic questions with 90% accuracy.    Baseline new goal    Time 10    Period --   sessions   Status New      Additional Short Term Goals   Additional Short Term Goals Yes      SLP SHORT TERM GOAL #6   Title Pt will demonstrate recall of basic novel information with 80% accuracy and minimal cues.    Baseline new goal    Time 10    Period --   sessions   Status New            SLP Long Term Goals - 09/23/20 1444      SLP LONG TERM GOAL #1   Title Patient  and husband will identify cognitive-communication barriers and participate in developing functional  compensatory strategies.    Status On-going      SLP LONG TERM GOAL #2   Title Patient will demonstrate functional cognitive-communication skills for independent completion of personal responsibilities and leisure activities.    Status On-going            Plan - 09/28/20 1706    Clinical Impression Statement Skilled treatment session focused on pt's cognitive communication goals. SLP facilitated session by providing minimal assistance for task organization. Pt demonstrated greater understanding as she required fewer cues than in previous session. Pt states that she is very proud of herself, this is huge as pt doesn't readily accept or compliment herself.    Speech Therapy Frequency 2x / week    Duration 12 weeks    Treatment/Interventions Cognitive reorganization;SLP instruction and feedback;Internal/external aids;Cueing hierarchy;Functional tasks;Patient/family education    Potential to Achieve Goals Good    SLP Home Exercise Plan continue task organization with jigsaw puzzle    Consulted and Agree with Plan of Care Patient;Family member/caregiver    Family Member Consulted pt's husband           Patient will benefit from skilled therapeutic intervention in order to improve the following deficits and impairments:   Cognitive communication deficit    Problem List There are no problems to display for this patient.  Pleas Carneal B. Dreama Saa M.S., CCC-SLP, [facility] Speech-Language Pathologist Rehabilitation Services Office [phone]  Reuel Derby 09/28/2020, 5:09 PM  [facility] MAIN [facility] SERVICES [address] [place], [place], [address] Phone: [phone]   Fax:  [phone]   Name: Barbara Wilkins MRN: [id number] Date of Birth: 10/17/1946

## 2020-09-30 ENCOUNTER — Other Ambulatory Visit: Payer: Self-pay

## 2020-09-30 ENCOUNTER — Ambulatory Visit: Payer: Medicare Other | Admitting: Speech Pathology

## 2020-09-30 DIAGNOSIS — R41841 Cognitive communication deficit: Secondary | ICD-10-CM | POA: Diagnosis not present

## 2020-10-01 NOTE — Therapy (Signed)
[facility] MAIN [facility] SERVICES [address] [place], [place], [address] Phone: [phone]   Fax:  [phone]  Speech Language Pathology Treatment  Patient Details  Name: Barbara Wilkins MRN: [id number] Date of Birth: 1946-08-13 Referring Provider (SLP): Junious Silk Date: 09/30/2020   End of Session - 10/01/20 1236    Visit Number 23    Number of Visits 25    Date for SLP Re-Evaluation 10/05/20    Authorization Type [employer]    Authorization Time Period 07/12/2020 thru 10/05/2020    Authorization - Visit Number 3    Progress Note Due on Visit 10    SLP Start Time 1100    SLP Stop Time  1200    SLP Time Calculation (min) 60 min    Activity Tolerance Patient tolerated treatment well           No past medical history on file.  No past surgical history on file.  There were no vitals filed for this visit.   Subjective Assessment - 10/01/20 1235    Subjective pt appeared to feel better, more engaged in tasks, expresses incrased pride in herself    Currently in Pain? No/denies                 ADULT SLP TREATMENT - 10/01/20 0001      General Information   Behavior/Cognition Alert;Cooperative;Pleasant mood    HPI Pt is a 73 y/.o. patient of Dr Sherryll Burger who was referral for formal cognitive assessment and cognitive training d/t new diagnosis of dementia. 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 SLUMS done in office today was 10/30." MRI on 05/14/2020, which showed bilateral frontal and hippocampal atrophy, mild white matter microvascular ischemic and metabolic changes.       Treatment  Provided   Treatment provided Cognitive-Linquistic      Pain Assessment   Pain Assessment No/denies pain      Cognitive-Linquistic Treatment   Treatment focused on Cognition;Patient/family/caregiver education    Skilled Treatment Minimal assistance to supervision cues when organizing and completing known jigsaw puzzles, maximal cues to carryover organizational problem-solving strategies to similar novel jigsaw puzzle       Assessment / Recommendations / Plan   Plan Continue with current plan of care      Progression Toward Goals   Progression toward goals Progressing toward goals            SLP Education - 10/01/20 1236    Education Details tssk organization    Person(s) Educated Patient    Methods Explanation    Comprehension Verbalized understanding            SLP Short Term Goals - 09/23/20 1438      SLP SHORT TERM GOAL #1   Status Achieved      SLP SHORT TERM GOAL #2   Status Achieved      SLP SHORT TERM GOAL #3   Status Achieved      SLP SHORT TERM GOAL #4   Title With minimal cues, pt will complete semi-complex cognitive training tasks that target preservation and stmulation of cognitive functions.    Baseline Minimal assistance with basic cognitive tasks  Time 10    Period --   sessions   Status New      SLP SHORT TERM GOAL #5   Title Pt will demonstrate increased understanding of dementia by answering basic questions with 90% accuracy.    Baseline new goal    Time 10    Period --   sessions   Status New      Additional Short Term Goals   Additional Short Term Goals Yes      SLP SHORT TERM GOAL #6   Title Pt will demonstrate recall of basic novel information with 80% accuracy and minimal cues.    Baseline new goal    Time 10    Period --   sessions   Status New            SLP Long Term Goals - 09/23/20 1444      SLP LONG TERM GOAL #1   Title Patient  and husband will identify cognitive-communication barriers and participate in  developing functional compensatory strategies.    Status On-going      SLP LONG TERM GOAL #2   Title Patient will demonstrate functional cognitive-communication skills for independent completion of personal responsibilities and leisure activities.    Status On-going            Plan - 10/01/20 1236    Clinical Impression Statement Skilled treatment session targeted pt's ongoing cognitive communication goals. SLP facilitated session by providing less cues to complete known jigsaw puzzle. Similar novel puzzle was introduced and maximal cues to sort edge and inside pieces and for sequential way to evaluate each piece for the right fit. Pt able to demonstrate understanding after initial inability to demonstrate carryover of strategies to novel puzzle.    Speech Therapy Frequency 2x / week    Duration 12 weeks    Treatment/Interventions Cognitive reorganization;SLP instruction and feedback;Internal/external aids;Cueing hierarchy;Functional tasks;Patient/family education    Potential to Achieve Goals Good    Potential Considerations Ability to learn/carryover information    SLP Home Exercise Plan continue task organization with jigsaw puzzle    Consulted and Agree with Plan of Care Patient           Patient will benefit from skilled therapeutic intervention in order to improve the following deficits and impairments:   Cognitive communication deficit    Problem List There are no problems to display for this patient.  Barbara Wilkins B. Barbara Wilkins M.S., CCC-SLP, [facility] Speech-Language Pathologist Rehabilitation Services Office [phone]  Barbara Wilkins 10/01/2020, 12:38 PM  [facility] MAIN [facility] SERVICES [address] [place], [place], [address] Phone: [phone]   Fax:  [phone]   Name: Barbara Wilkins MRN: [id number] Date of Birth: September 05, 1946

## 2020-10-05 ENCOUNTER — Ambulatory Visit: Payer: Medicare Other | Admitting: Speech Pathology

## 2020-10-05 ENCOUNTER — Other Ambulatory Visit: Payer: Self-pay

## 2020-10-05 DIAGNOSIS — R41841 Cognitive communication deficit: Secondary | ICD-10-CM

## 2020-10-06 ENCOUNTER — Encounter: Payer: Self-pay | Admitting: Speech Pathology

## 2020-10-06 NOTE — Therapy (Signed)
[facility] MAIN [facility] SERVICES [address] [place], [place], [address] Phone: [phone]   Fax:  [phone]  Speech Language Pathology Treatment  Patient Details  Name: Barbara Wilkins MRN: [id number] Date of Birth: 07-Dec-1945 Referring Provider (SLP): Junious Silk Date: 10/05/2020   End of Session - 10/06/20 1319    Visit Number 24    Number of Visits 25    Date for SLP Re-Evaluation 10/05/20    Authorization Type [employer]    Authorization Time Period 07/12/2020 thru 10/05/2020    Authorization - Visit Number 4    Progress Note Due on Visit 10    SLP Start Time 1100    SLP Stop Time  1220    SLP Time Calculation (min) 80 min    Activity Tolerance Patient tolerated treatment well           History reviewed. No pertinent past medical history.  History reviewed. No pertinent surgical history.  There were no vitals filed for this visit.   Subjective Assessment - 10/06/20 1315    Subjective pt appeared in great spirits, she is very proud of herself for independently completing a puzzle at home    Patient is accompained by: Family member    Currently in Pain? No/denies                 ADULT SLP TREATMENT - 10/06/20 0001      General Information   Behavior/Cognition Alert;Cooperative;Pleasant mood    HPI Pt is a 73 y/.o. patient of Dr Sherryll Burger who was referral for formal cognitive assessment and cognitive training d/t new diagnosis of dementia. 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 SLUMS done in office today was 10/30." MRI on 05/14/2020, which showed bilateral frontal and  hippocampal atrophy, mild white matter microvascular ischemic and metabolic changes.       Treatment Provided   Treatment provided Cognitive-Linquistic      Pain Assessment   Pain Assessment No/denies pain      Cognitive-Linquistic Treatment   Treatment focused on Cognition;Patient/family/caregiver education    Skilled Treatment Introduced new book, data collected on recall after cold read; and increased abiity after activites surrounding the book, memory retention assessed during Solitaire game, great retention      Assessment / Recommendations / Plan   Plan Continue with current plan of care      Progression Toward Goals   Progression toward goals Progressing toward goals            SLP Education - 10/06/20 1319    Education Details memory    Person(s) Educated Patient;Spouse    Methods Explanation;Demonstration    Comprehension Verbalized understanding;Returned demonstration            SLP Short Term Goals - 09/23/20 1438      SLP SHORT TERM GOAL #1   Status Achieved      SLP SHORT TERM GOAL #2   Status Achieved      SLP SHORT TERM GOAL #3   Status Achieved      SLP SHORT TERM GOAL #4   Title With minimal cues, pt will complete semi-complex cognitive training tasks that target preservation and stmulation of  cognitive functions.    Baseline Minimal assistance with basic cognitive tasks    Time 10    Period --   sessions   Status New      SLP SHORT TERM GOAL #5   Title Pt will demonstrate increased understanding of dementia by answering basic questions with 90% accuracy.    Baseline new goal    Time 10    Period --   sessions   Status New      Additional Short Term Goals   Additional Short Term Goals Yes      SLP SHORT TERM GOAL #6   Title Pt will demonstrate recall of basic novel information with 80% accuracy and minimal cues.    Baseline new goal    Time 10    Period --   sessions   Status New            SLP Long Term Goals - 09/23/20 1444       SLP LONG TERM GOAL #1   Title Patient  and husband will identify cognitive-communication barriers and participate in developing functional compensatory strategies.    Status On-going      SLP LONG TERM GOAL #2   Title Patient will demonstrate functional cognitive-communication skills for independent completion of personal responsibilities and leisure activities.    Status On-going            Plan - 10/06/20 1320    Clinical Impression Statement Skilled treatment session targeted pt's cognitive training. SLP facilitated session by introducing new book. Pt read with minimal assistance and after reading she was able to recall 3 facts and answer questions about the content with Mod I. Pt then engaged in sentence completion activity to promote improve recall. After activity, pt demonstrated increased recall improving to 6 facts. SLP also assessed pt's ability to retain information by providing game of Solitaire. Pt has not played in many weeks. She demonstrated great retention of the game as she independently demonstrated 90% accuracy with recall.    Speech Therapy Frequency 2x / week    Duration 12 weeks    Treatment/Interventions Cognitive reorganization;SLP instruction and feedback;Internal/external aids;Cueing hierarchy;Functional tasks;Patient/family education    Potential to Achieve Goals Good    SLP Home Exercise Plan read book, complete provided activities about book    Consulted and Agree with Plan of Care Patient;Family member/caregiver    Family Member Consulted pt's husband           Patient will benefit from skilled therapeutic intervention in order to improve the following deficits and impairments:   Cognitive communication deficit    Problem List There are no problems to display for this patient.  Kielee Care B. Dreama Saa M.S., CCC-SLP, [facility] Speech-Language Pathologist Rehabilitation Services Office [phone]  Reuel Derby 10/06/2020, 1:25 PM  [place] MAIN [facility] SERVICES [address] [place], [place], [address] Phone: [phone]   Fax:  [phone]   Name: Barbara Wilkins MRN: [id number] Date of Birth: [birth year]

## 2020-10-11 ENCOUNTER — Other Ambulatory Visit: Payer: Self-pay

## 2020-10-11 ENCOUNTER — Ambulatory Visit
Admission: RE | Admit: 2020-10-11 | Discharge: 2020-10-11 | Disposition: A | Discharge status: Home / Self Care | Payer: Medicare Other | Source: Ambulatory Visit | Attending: Neurology | Admitting: Neurology

## 2020-10-11 DIAGNOSIS — Z8669 Personal history of other diseases of the nervous system and sense organs: Secondary | ICD-10-CM | POA: Insufficient documentation

## 2020-10-11 DIAGNOSIS — I6782 Cerebral ischemia: Secondary | ICD-10-CM | POA: Diagnosis not present

## 2020-10-11 DIAGNOSIS — J323 Chronic sphenoidal sinusitis: Secondary | ICD-10-CM | POA: Diagnosis not present

## 2020-10-11 IMAGING — MR MR HEAD WO/W CM
13 series · 45 of 48 positions shown · IV contrast (gadavist)
Comparison: Brain MRI [DATE].

CLINICAL DATA: History of migraine headaches. Additional history
provided by scanning technologist: Patient reports chronic headaches
with balance issues.

EXAM:
MRI HEAD WITHOUT AND WITH CONTRAST
MRA HEAD WITHOUT CONTRAST
TECHNIQUE: Multiplanar, multiecho pulse sequences of the brain and surrounding
structures were obtained without and with intravenous contrast.
Angiographic images of the head were obtained using MRA technique
without contrast.
CONTRAST:  5mL GADAVIST GADOBUTROL 1 MMOL/ML IV SOLN

[Series 5: ax dwi_tracew · axial · 3.0mm · 0.60mm/px · z∈[-150,+4]mm · 4 of 48 slices shown]
[im 1/48]
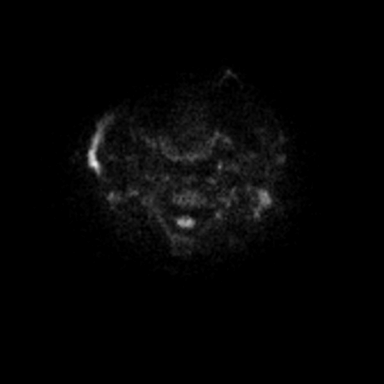
[im 16/48]
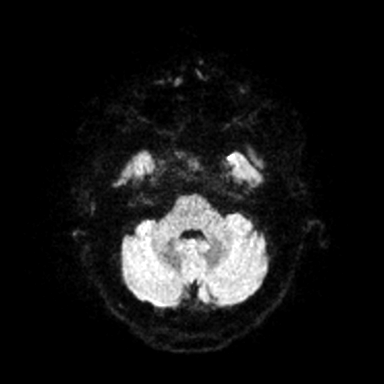
[im 32/48]
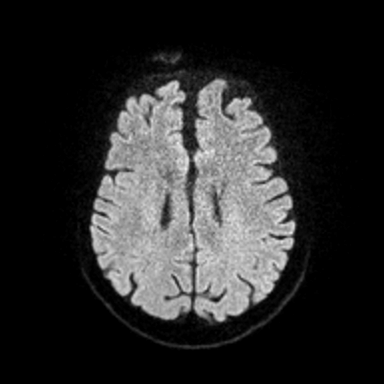
[im 48/48]
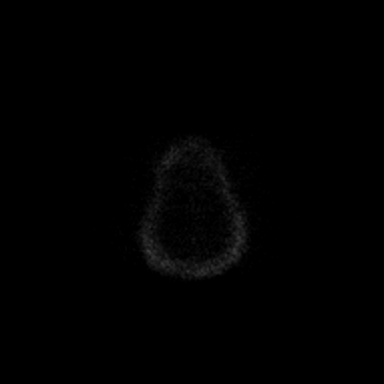

[Series 6: ax dwi_adc · axial · 3.0mm · 0.60mm/px · z∈[-150,+4]mm · 3 of 48 slices shown]
[im 1/48]
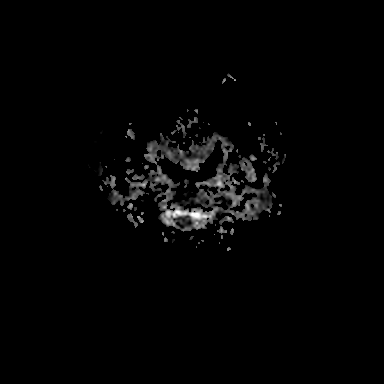
[im 24/48]
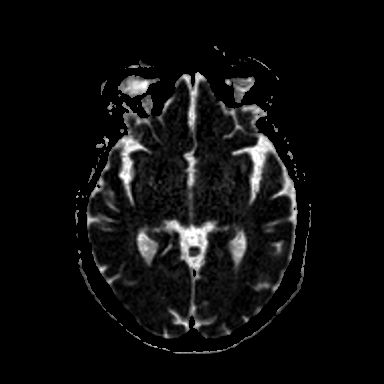
[im 48/48]
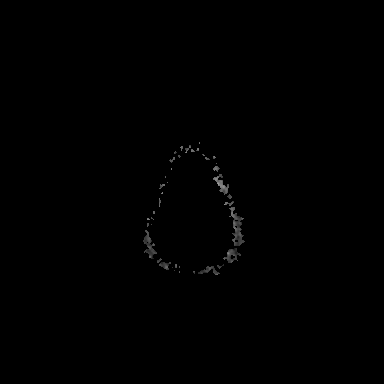

[Series 7: cor dwi_tracew · coronal · 5.0mm · 0.60mm/px · 2 of 38 slices shown]
[im 1/38]
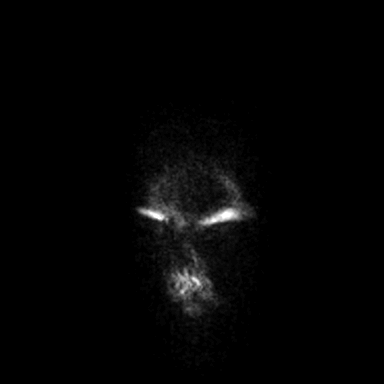
[im 38/38]
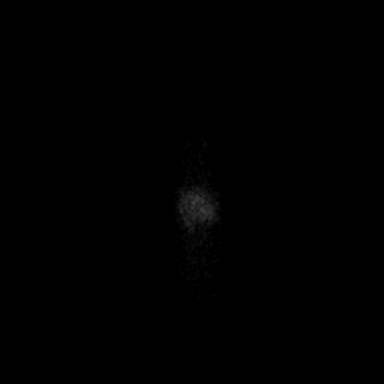

[Series 8: cor dwi_adc · coronal · 5.0mm · 0.60mm/px · 2 of 36 slices shown]
[im 1/36]
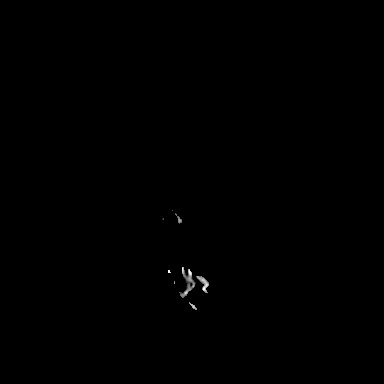
[im 36/36]
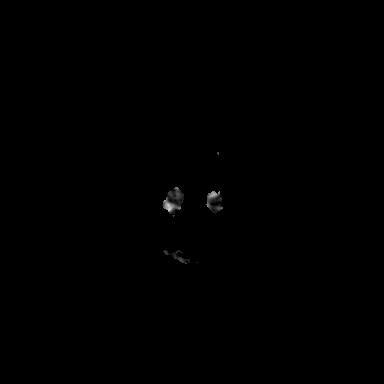

[Series 9: T1 · sagittal · 5.0mm · 0.62mm/px · 1 of 25 slices shown (1 of 2)]
[im 1/25]
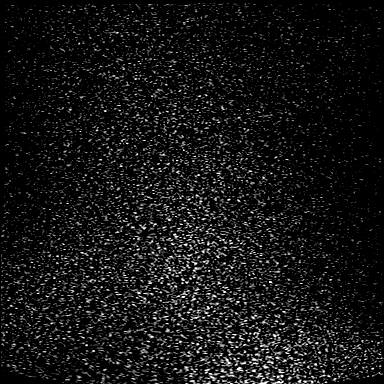

[Series 10: T2 · axial · 5.0mm · 0.53mm/px · 1 of 25 slices shown]
[im 1/25]
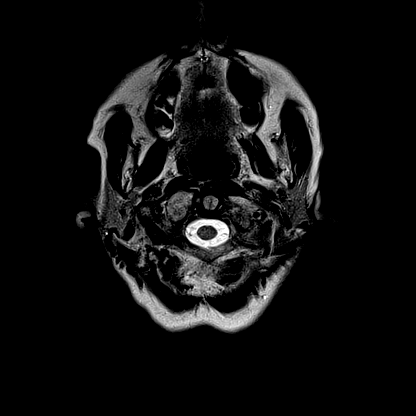

[Series 12: pha_images · axial · 3.0mm · 0.90mm/px · z∈[-150,+25]mm · 4 of 60 slices shown]
[im 1/60]
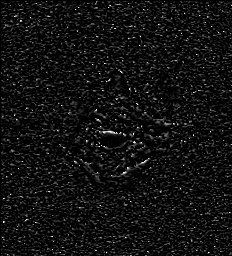
[im 20/60]
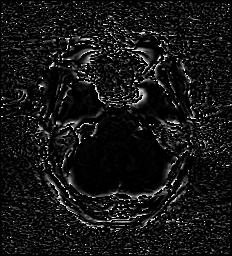
[im 40/60]
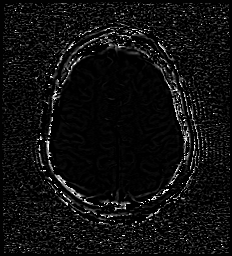
[im 60/60]
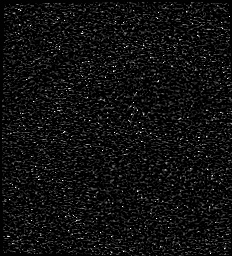

[Series 13: swi_images · axial · 3.0mm · 0.90mm/px · z∈[-150,+25]mm · 4 of 60 slices shown]
[im 1/60]
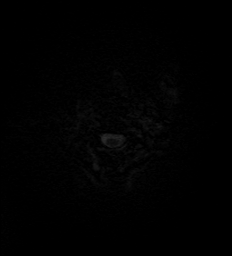
[im 20/60]
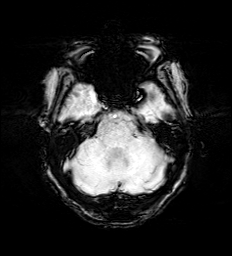
[im 40/60]
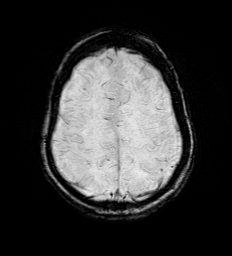
[im 60/60]
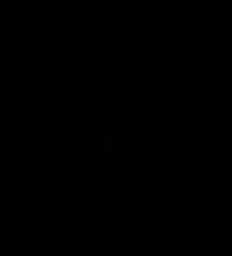

[Series 15: FLAIR · axial · 3.0mm · 0.53mm/px · z∈[-143,+17]mm · 3 of 55 slices shown]
[im 1/55]
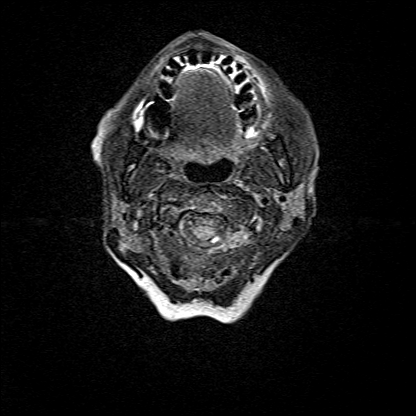
[im 28/55]
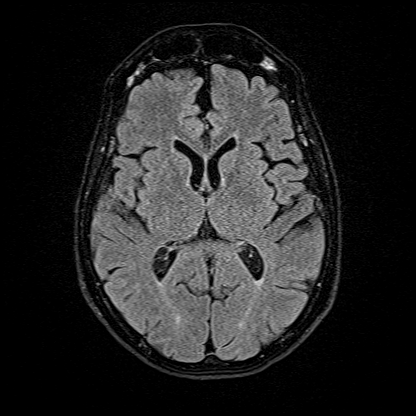
[im 55/55]
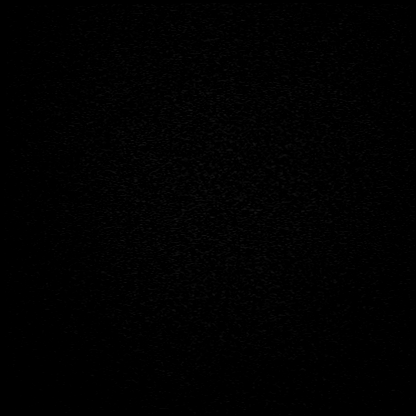

[Series 16: T1 · axial · 1.0mm · 0.98mm/px · z∈[-151,+22]mm · 8 of 176 slices shown (2 of 2)]
[im 1/176]
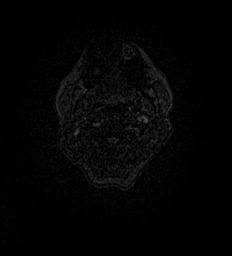
[im 20/176]
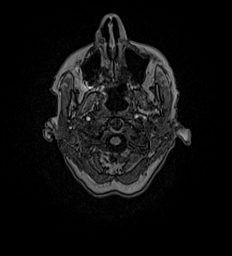
[im 59/176]
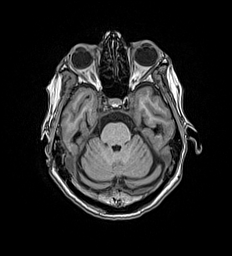
[im 78/176]
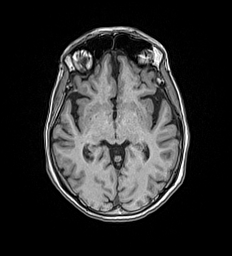
[im 98/176]
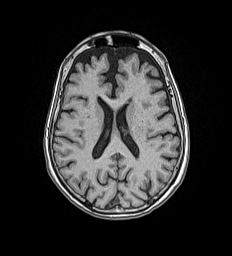
[im 117/176]
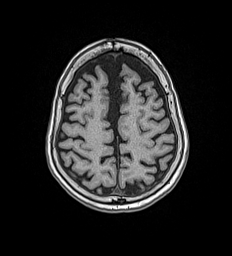
[im 156/176]
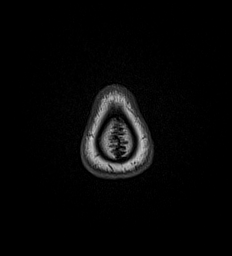
[im 176/176]
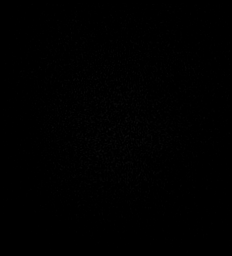

[Series 17: T2 post-contrast · coronal · 5.0mm · 0.57mm/px · 2 of 29 slices shown]
[im 1/29]
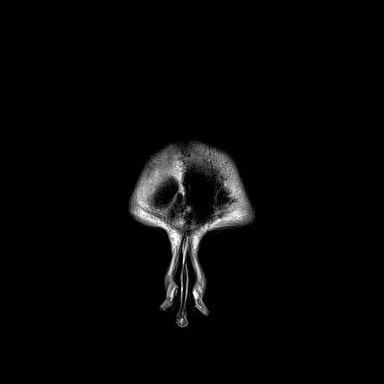
[im 29/29]
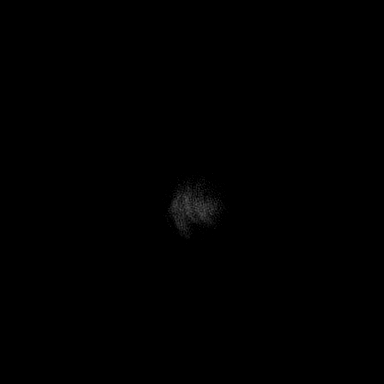

[Series 18: T1 post-contrast · axial · 1.0mm · 0.98mm/px · z∈[-151,+22]mm · 9 of 176 slices shown (1 of 2)]
[im 1/176]
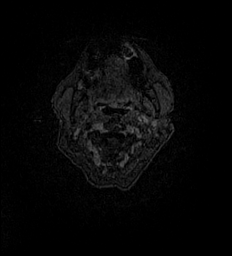
[im 20/176]
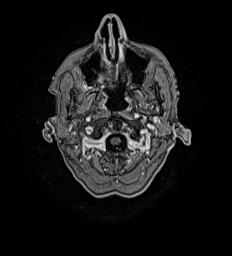
[im 39/176]
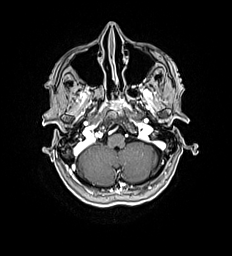
[im 59/176]
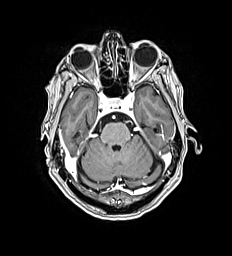
[im 78/176]
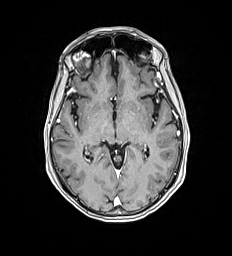
[im 98/176]
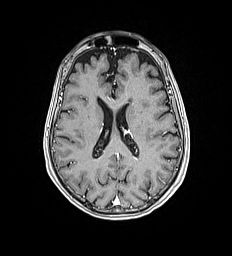
[im 117/176]
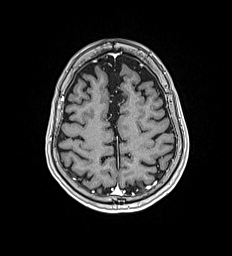
[im 156/176]
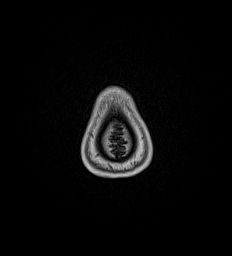
[im 176/176]
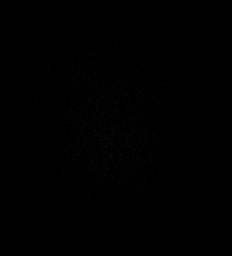

[Series 19: T1 post-contrast · coronal · 5.0mm · 0.57mm/px · 2 of 29 slices shown (2 of 2)]
[im 1/29]
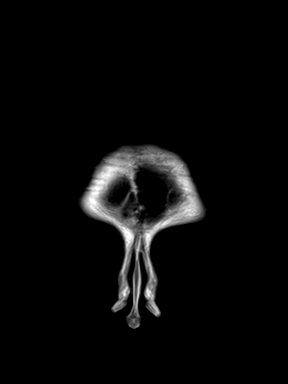
[im 29/29]
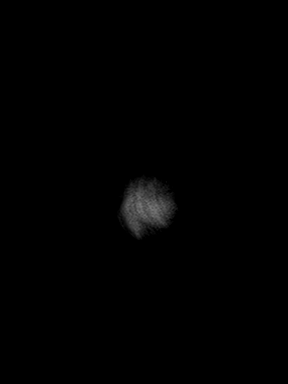

[45 of 48 positions shown; findings below may reference images not displayed]

FINDINGS: MRI HEAD FINDINGS

Brain:

Moderate frontal predominant cerebral atrophy, stable as compared to
the brain MRI of [DATE].

Minimal multifocal T2/FLAIR hyperintensity within the cerebral white
matter is nonspecific, but compatible with chronic small vessel
ischemic disease.

There is no acute infarct.

No evidence of intracranial mass.

No chronic intracranial blood products.

No extra-axial fluid collection.

No midline shift.

No abnormal intracranial enhancement.

Vascular: Expected proximal arterial flow voids. Expected vascular
enhancement.

Skull and upper cervical spine: No focal marrow lesion.

Sinuses/Orbits: Visualized orbits show no acute finding. Inspissated
secretions and mild mucosal thickening within the right sphenoid
sinus.

Other: Trace fluid within the bilateral mastoid air cells.

MRA HEAD FINDINGS

Mildly motion degraded examination.

The intracranial internal carotid arteries are patent. The M1 middle
cerebral arteries are patent. Atherosclerotic irregularity of the M2
and more distal MCA branch vessels. No M2 proximal branch occlusion
or high-grade proximal stenosis. The anterior cerebral arteries are
patent.

The intracranial vertebral arteries are patent. The basilar artery
is patent. The posterior cerebral arteries are patent. Mild
atherosclerotic irregularity of the P2 left PCA. Posterior
communicating arteries are hypoplastic or absent bilaterally.

No intracranial aneurysm is identified.
IMPRESSION: MRI brain:

1. Stable MRI appearance of the brain as compared to [DATE].
2. No evidence of acute intracranial abnormality.
3. Mild frontal predominant cerebral atrophy.
4. Mild cerebral white matter chronic small vessel ischemic disease.
5. Right sphenoid sinusitis.
6. Trace bilateral mastoid effusions.

MRA head:

1. Mildly motion degraded examination.
2. No intracranial large vessel occlusion or proximal high-grade
arterial stenosis.
3. Atherosclerotic irregularity of the M2 and more distal MCA branch
vessels bilaterally.
4. Mild atherosclerotic irregularity of the left PCA P2 segment.

## 2020-10-11 IMAGING — MR MR MRA HEAD W/O CM
1 series · 18 of 48 positions shown · IV contrast (gadavist)
Comparison: Brain MRI [DATE].

CLINICAL DATA: History of migraine headaches. Additional history
provided by scanning technologist: Patient reports chronic headaches
with balance issues.

EXAM:
MRI HEAD WITHOUT AND WITH CONTRAST
MRA HEAD WITHOUT CONTRAST
TECHNIQUE: Multiplanar, multiecho pulse sequences of the brain and surrounding
structures were obtained without and with intravenous contrast.
Angiographic images of the head were obtained using MRA technique
without contrast.
CONTRAST:  5mL GADAVIST GADOBUTROL 1 MMOL/ML IV SOLN

[Series 5: TOF · axial · 0.5mm · 0.41mm/px · z∈[-145,-50]mm · 18 of 205 slices shown]
[im 1/205]
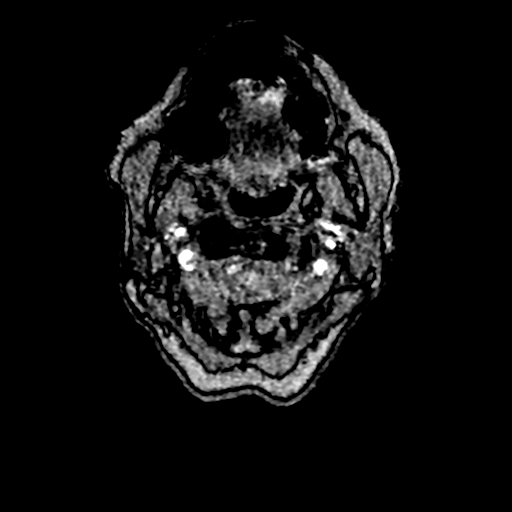
[im 5/205]
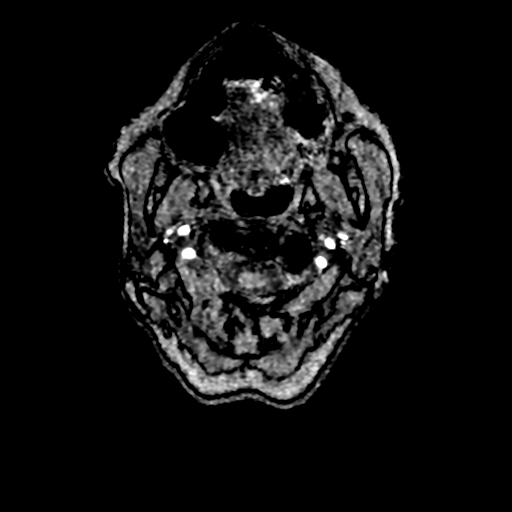
[im 9/205]
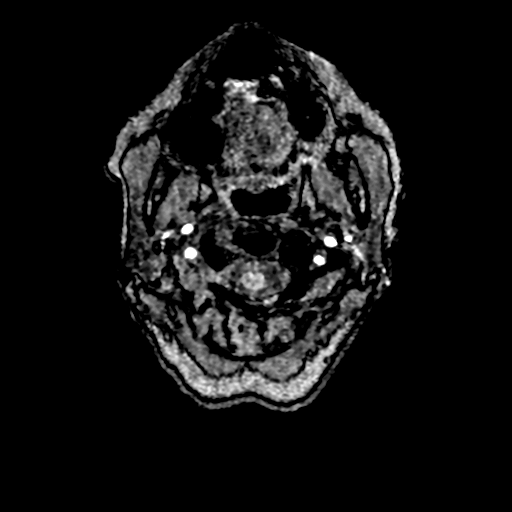
[im 14/205]
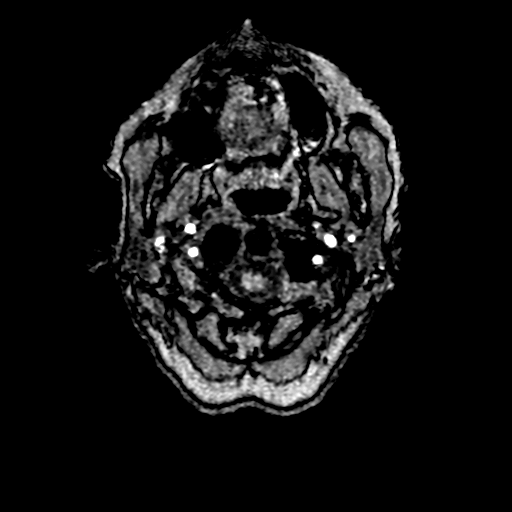
[im 18/205]
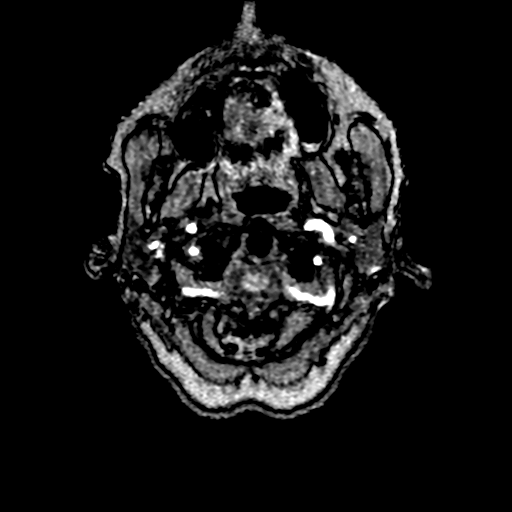
[im 22/205]
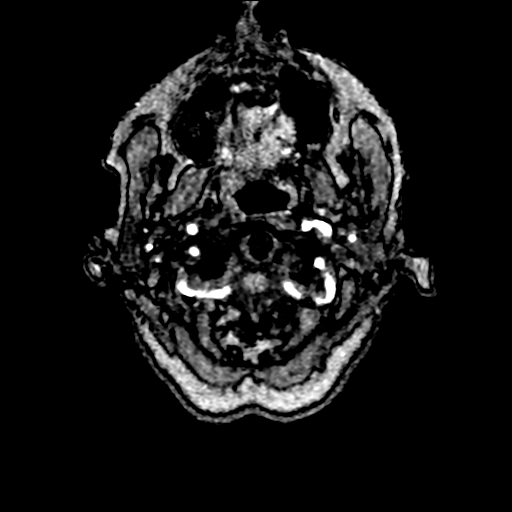
[im 27/205]
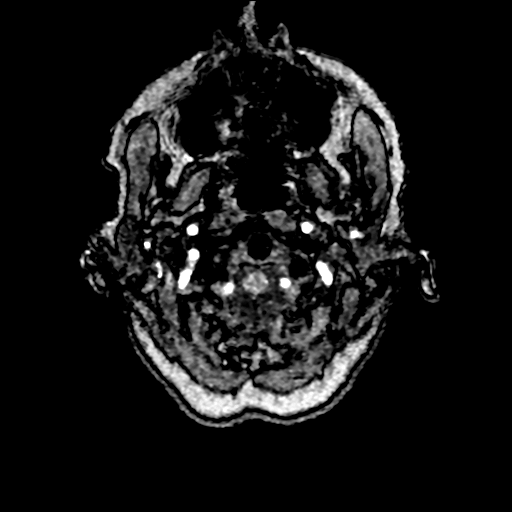
[im 31/205]
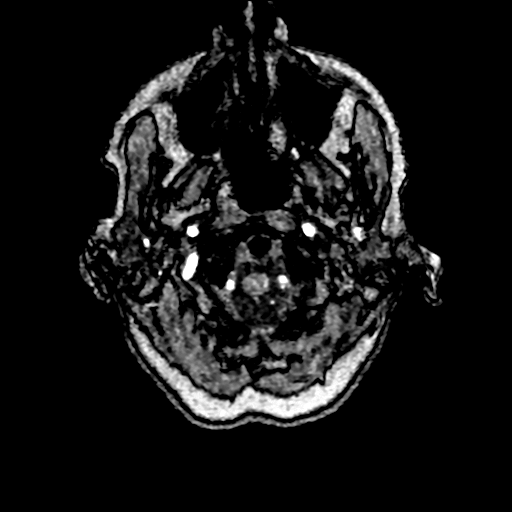
[im 35/205]
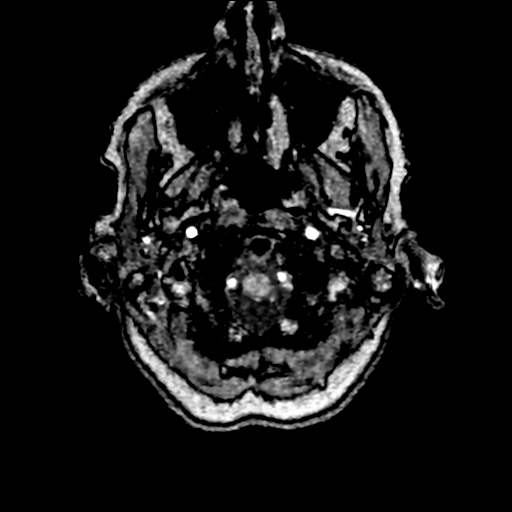
[im 40/205]
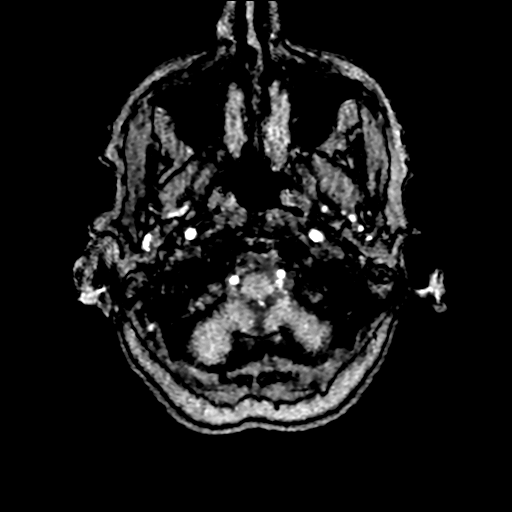
[im 66/205]
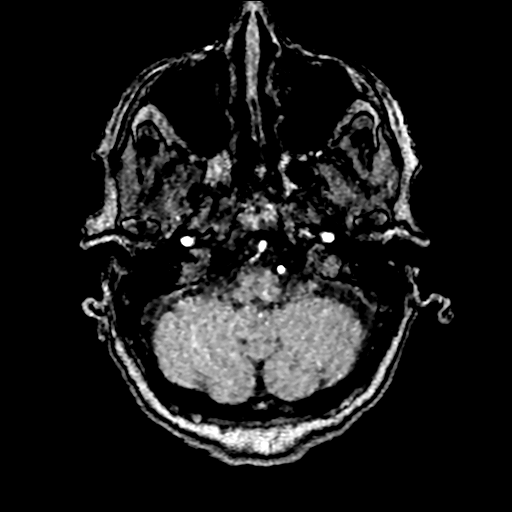
[im 92/205]
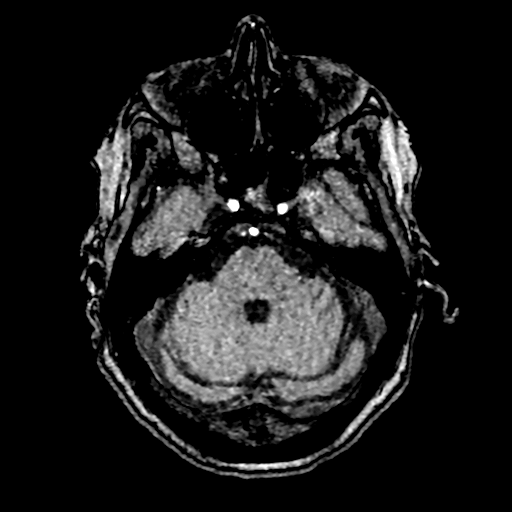
[im 105/205]
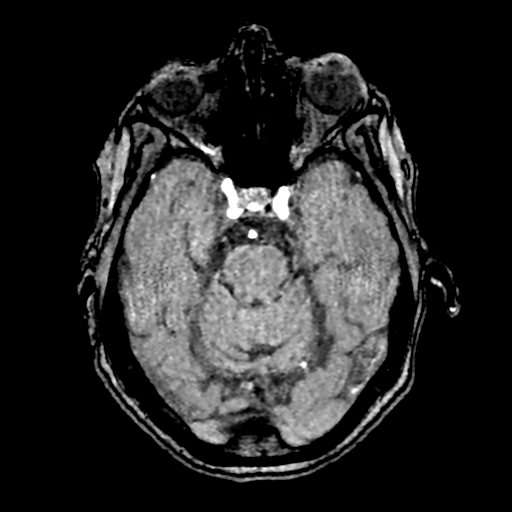
[im 118/205]
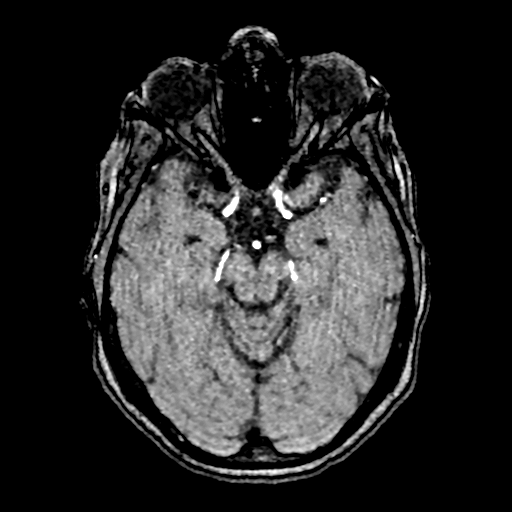
[im 144/205]
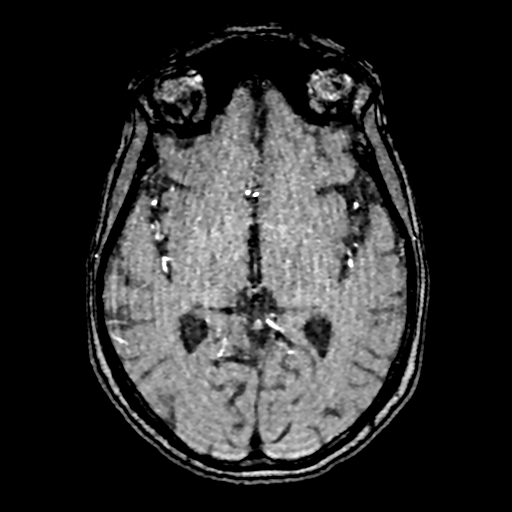
[im 170/205]
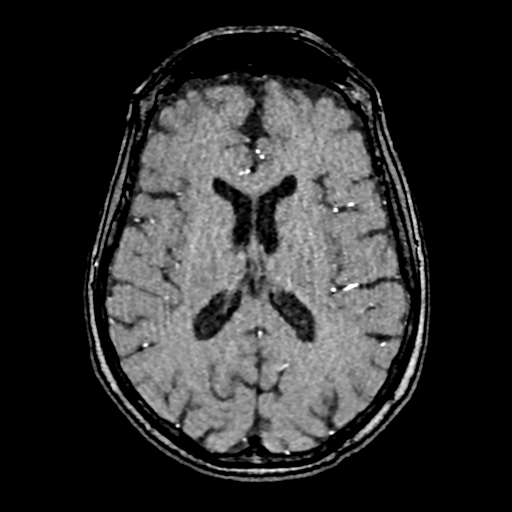
[im 174/205]
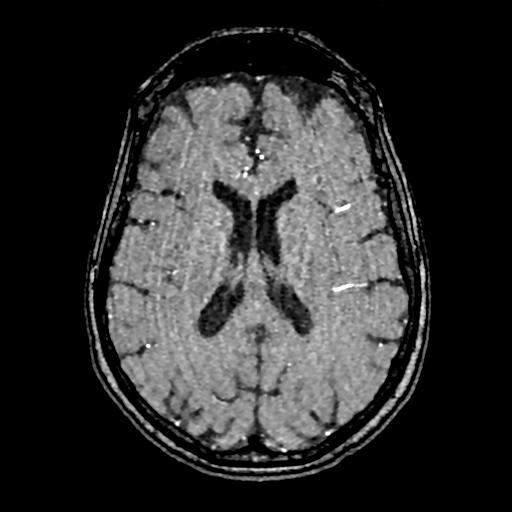
[im 196/205]
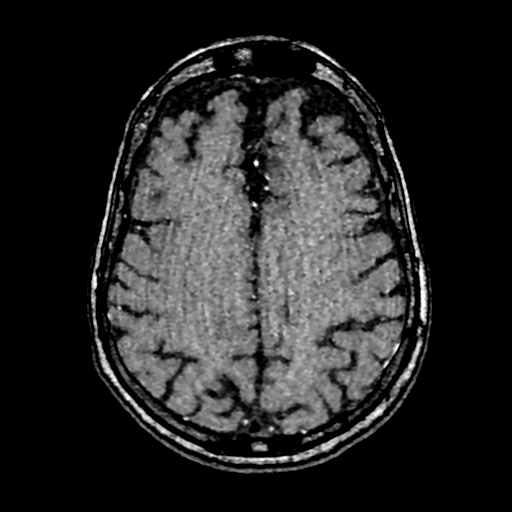

[18 of 48 positions shown; findings below may reference images not displayed]

FINDINGS: MRI HEAD FINDINGS

Brain:

Moderate frontal predominant cerebral atrophy, stable as compared to
the brain MRI of [DATE].

Minimal multifocal T2/FLAIR hyperintensity within the cerebral white
matter is nonspecific, but compatible with chronic small vessel
ischemic disease.

There is no acute infarct.

No evidence of intracranial mass.

No chronic intracranial blood products.

No extra-axial fluid collection.

No midline shift.

No abnormal intracranial enhancement.

Vascular: Expected proximal arterial flow voids. Expected vascular
enhancement.

Skull and upper cervical spine: No focal marrow lesion.

Sinuses/Orbits: Visualized orbits show no acute finding. Inspissated
secretions and mild mucosal thickening within the right sphenoid
sinus.

Other: Trace fluid within the bilateral mastoid air cells.

MRA HEAD FINDINGS

Mildly motion degraded examination.

The intracranial internal carotid arteries are patent. The M1 middle
cerebral arteries are patent. Atherosclerotic irregularity of the M2
and more distal MCA branch vessels. No M2 proximal branch occlusion
or high-grade proximal stenosis. The anterior cerebral arteries are
patent.

The intracranial vertebral arteries are patent. The basilar artery
is patent. The posterior cerebral arteries are patent. Mild
atherosclerotic irregularity of the P2 left PCA. Posterior
communicating arteries are hypoplastic or absent bilaterally.

No intracranial aneurysm is identified.
IMPRESSION: MRI brain:

1. Stable MRI appearance of the brain as compared to [DATE].
2. No evidence of acute intracranial abnormality.
3. Mild frontal predominant cerebral atrophy.
4. Mild cerebral white matter chronic small vessel ischemic disease.
5. Right sphenoid sinusitis.
6. Trace bilateral mastoid effusions.

MRA head:

1. Mildly motion degraded examination.
2. No intracranial large vessel occlusion or proximal high-grade
arterial stenosis.
3. Atherosclerotic irregularity of the M2 and more distal MCA branch
vessels bilaterally.
4. Mild atherosclerotic irregularity of the left PCA P2 segment.

## 2020-10-11 MED ORDER — GADOBUTROL 1 MMOL/ML IV SOLN
5.0000 mL | Freq: Once | INTRAVENOUS | Status: AC | PRN
  Administered 2020-10-11: 5 mL via INTRAVENOUS

## 2020-10-12 ENCOUNTER — Ambulatory Visit: Payer: Medicare Other | Admitting: Speech Pathology

## 2020-10-12 DIAGNOSIS — R41841 Cognitive communication deficit: Secondary | ICD-10-CM

## 2020-10-13 ENCOUNTER — Encounter: Payer: Self-pay | Admitting: Speech Pathology

## 2020-10-13 NOTE — Therapy (Addendum)
[facility] MAIN [facility] SERVICES [address] [place], [place], [address] Phone: [phone]   Fax:  [phone]  Speech Language Pathology Treatment  Patient Details  Name: Barbara Wilkins MRN: [id number] Date of Birth: Dec 25, 1945 Referring Provider (SLP): Junious Silk Date: 10/12/2020   End of Session - 10/13/20 1211    Visit Number 25    Number of Visits 25    Date for SLP Re-Evaluation 01/04/21    Authorization Type [employer]    Authorization Time Period 10/12/2020 thru 01/04/2021    Authorization - Visit Number 6    Progress Note Due on Visit 10    SLP Start Time 1100    SLP Stop Time  1200    SLP Time Calculation (min) 60 min           History reviewed. No pertinent past medical history.  History reviewed. No pertinent surgical history.  There were no vitals filed for this visit.   Subjective Assessment - 10/13/20 1210    Subjective Pt appeared slightly more subdued, likely response to recent placement of her daughter in rehab    Patient is accompained by: Family member    Currently in Pain? No/denies                 ADULT SLP TREATMENT - 10/13/20 0001      General Information   Behavior/Cognition Alert;Cooperative;Pleasant mood    HPI Pt is a 73 y/.o. patient of Dr Sherryll Burger who was referral for formal cognitive assessment and cognitive training d/t new diagnosis of dementia. 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 SLUMS done in office today was 10/30." MRI on 05/14/2020, which showed bilateral frontal and hippocampal atrophy, mild white matter microvascular ischemic and  metabolic changes.       Treatment Provided   Treatment provided Cognitive-Linquistic      Pain Assessment   Pain Assessment No/denies pain      Cognitive-Linquistic Treatment   Treatment focused on Cognition;Patient/family/caregiver education    Skilled Treatment Independently recall 10 items from book      Assessment / Recommendations / Plan   Plan Continue with current plan of care      Progression Toward Goals   Progression toward goals Progressing toward goals            SLP Education - 10/13/20 1210    Education Details impacted that emotions has on cognitive abilities    Person(s) Educated Patient;Spouse    Methods Explanation    Comprehension Verbalized understanding            SLP Short Term Goals - 09/23/20 1438      SLP SHORT TERM GOAL #1   Status Achieved      SLP SHORT TERM GOAL #2   Status Achieved      SLP SHORT TERM GOAL #3   Status Achieved      SLP SHORT TERM GOAL #4   Title With minimal cues, pt will complete semi-complex cognitive training tasks that target preservation and stmulation of cognitive functions.    Baseline Minimal assistance with basic cognitive tasks    Time 10    Period --   sessions  Status New      SLP SHORT TERM GOAL #5   Title Pt will demonstrate increased understanding of dementia by answering basic questions with 90% accuracy.    Baseline new goal    Time 10    Period --   sessions   Status New      Additional Short Term Goals   Additional Short Term Goals Yes      SLP SHORT TERM GOAL #6   Title Pt will demonstrate recall of basic novel information with 80% accuracy and minimal cues.    Baseline new goal    Time 10    Period --   sessions   Status New            SLP Long Term Goals - 10/13/20 1213      SLP LONG TERM GOAL #1   Title Patient  and husband will identify cognitive-communication barriers and participate in developing functional compensatory strategies.    Status On-going      SLP LONG  TERM GOAL #2   Title Patient will demonstrate functional cognitive-communication skills for independent completion of personal responsibilities and leisure activities.    Status On-going            Plan - 10/13/20 1213    Clinical Impression Statement Pt continues to demonstrate the ability for new learning. This is evidenced by functional recall of information of novel concepts even after extended delay (multiple sessions). More importantly, pt has a new self-confidence and no longer feels impaired d/t her diagnosis of dementia. As such, ST is requesting a re-certification for another 12 weeks of ST intervention.    Speech Therapy Frequency 3x / week    Duration 12 weeks    Treatment/Interventions Cognitive reorganization;SLP instruction and feedback;Internal/external aids;Cueing hierarchy;Functional tasks;Patient/family education    Potential to Achieve Goals Good    SLP Home Exercise Plan read book, complete provided activities about book    Consulted and Agree with Plan of Care Patient;Family member/caregiver    Family Member Consulted pt's husband           Patient will benefit from skilled therapeutic intervention in order to improve the following deficits and impairments:   Cognitive communication deficit    Problem List There are no problems to display for this patient.  Jayan Raymundo B. Dreama Saa M.S., CCC-SLP, [facility] Speech-Language Pathologist Rehabilitation Services Office [phone]  Reuel Derby 10/13/2020, 12:14 PM  [facility] MAIN [facility] SERVICES [address] [place], [place], [address] Phone: [phone]   Fax:  [phone]   Name: Barbara Wilkins MRN: [id number] Date of Birth: [birth year]

## 2020-10-13 NOTE — Addendum Note (Signed)
Addended by: Reuel Derby B on: 10/13/2020 12:17 PM   Modules accepted: Orders

## 2020-10-14 ENCOUNTER — Ambulatory Visit: Payer: Medicare Other | Attending: Neurology | Admitting: Speech Pathology

## 2020-10-14 ENCOUNTER — Other Ambulatory Visit: Payer: Self-pay

## 2020-10-14 DIAGNOSIS — R41841 Cognitive communication deficit: Secondary | ICD-10-CM | POA: Insufficient documentation

## 2020-10-15 ENCOUNTER — Encounter: Payer: Self-pay | Admitting: Speech Pathology

## 2020-10-15 NOTE — Therapy (Signed)
[facility] MAIN [facility] SERVICES [address] [place], [place], [address] Phone: [phone]   Fax:  [phone]  Speech Language Pathology Treatment  Patient Details  Name: Barbara Wilkins MRN: [id number] Date of Birth: Jul 19, 1946 Referring Provider (SLP): Junious Silk Date: 10/14/2020   End of Session - 10/15/20 1037    Visit Number 26    Number of Visits 49    Date for SLP Re-Evaluation 01/04/21    Authorization Type [employer]    Authorization Time Period 10/12/2020 thru 01/04/2021    Authorization - Visit Number 7    Progress Note Due on Visit 10    SLP Start Time 1100    SLP Stop Time  1200    SLP Time Calculation (min) 60 min    Activity Tolerance Patient tolerated treatment well           History reviewed. No pertinent past medical history.  History reviewed. No pertinent surgical history.  There were no vitals filed for this visit.   Subjective Assessment - 10/15/20 1035    Subjective pt appeared in good spirits    Patient is accompained by: Family member    Currently in Pain? No/denies                 ADULT SLP TREATMENT - 10/15/20 0001      General Information   Behavior/Cognition Alert;Cooperative;Pleasant mood    HPI Pt is a 73 y/.o. patient of Dr Sherryll Burger who was referral for formal cognitive assessment and cognitive training d/t new diagnosis of dementia. 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 SLUMS done in office today was 10/30." MRI on 05/14/2020, which showed bilateral frontal and hippocampal atrophy, mild white matter microvascular ischemic and metabolic changes.        Treatment Provided   Treatment provided Cognitive-Linquistic      Pain Assessment   Pain Assessment No/denies pain      Cognitive-Linquistic Treatment   Treatment focused on Cognition;Patient/family/caregiver education    Skilled Treatment Novel craft activity, targeting sequencing, introduced: pt independently created model to help her recall sequence of craft      Assessment / Recommendations / Plan   Plan Continue with current plan of care      Progression Toward Goals   Progression toward goals Progressing toward goals            SLP Education - 10/15/20 1036    Education Details improved success as pt creating her own model of activity to refer to    [employer]) Educated Patient;Spouse    Methods Explanation;Demonstration    Comprehension Verbalized understanding;Returned demonstration            SLP Short Term Goals - 09/23/20 1438      SLP SHORT TERM GOAL #1   Status Achieved      SLP SHORT TERM GOAL #2   Status Achieved      SLP SHORT TERM GOAL #3   Status Achieved      SLP SHORT TERM GOAL #4   Title With minimal cues, pt will complete semi-complex cognitive training tasks that target preservation and stmulation of cognitive functions.    Baseline Minimal assistance with basic  cognitive tasks    Time 10    Period --   sessions   Status New      SLP SHORT TERM GOAL #5   Title Pt will demonstrate increased understanding of dementia by answering basic questions with 90% accuracy.    Baseline new goal    Time 10    Period --   sessions   Status New      Additional Short Term Goals   Additional Short Term Goals Yes      SLP SHORT TERM GOAL #6   Title Pt will demonstrate recall of basic novel information with 80% accuracy and minimal cues.    Baseline new goal    Time 10    Period --   sessions   Status New            SLP Long Term Goals - 10/13/20 1213      SLP LONG TERM GOAL #1   Title Patient  and husband will identify  cognitive-communication barriers and participate in developing functional compensatory strategies.    Status On-going      SLP LONG TERM GOAL #2   Title Patient will demonstrate functional cognitive-communication skills for independent completion of personal responsibilities and leisure activities.    Status On-going            Plan - 10/15/20 1037    Clinical Impression Statement Skilled treatment session targeted pt's cognition goals, specifically sequencing the activity. SLP facilitated session by introducing functional craft activity. After completing trial with SLP, pt had difficulty continuing task in sequential manner. Pt realized this and created a model with directions to refer to, which greatly improved her independence and accuracy with task.    Speech Therapy Frequency 2x / week    Duration 12 weeks    Treatment/Interventions Cognitive reorganization;SLP instruction and feedback;Internal/external aids;Cueing hierarchy;Functional tasks;Patient/family education    Potential to Achieve Goals Good    Consulted and Agree with Plan of Care Patient;Family member/caregiver    Family Member Consulted pt's husband           Patient will benefit from skilled therapeutic intervention in order to improve the following deficits and impairments:   Cognitive communication deficit    Problem List There are no problems to display for this patient.  Kamira Mellette B. Dreama Saa M.S., CCC-SLP, [facility] Speech-Language Pathologist Rehabilitation Services Office [phone]  Reuel Derby 10/15/2020, 10:38 AM  [facility] [facility] MAIN [facility] SERVICES [address] [place], [place], [address] Phone: [phone]   Fax:  [phone]   Name: Barbara Wilkins MRN: [id number] Date of Birth: 29-May-1946

## 2020-10-19 ENCOUNTER — Other Ambulatory Visit: Payer: Self-pay

## 2020-10-19 ENCOUNTER — Ambulatory Visit: Payer: Medicare Other | Admitting: Speech Pathology

## 2020-10-19 DIAGNOSIS — R41841 Cognitive communication deficit: Secondary | ICD-10-CM

## 2020-10-20 NOTE — Therapy (Signed)
[facility] MAIN [facility] SERVICES [address] [place], [place], [address] Phone: [phone]   Fax:  [phone]  Speech Language Pathology Treatment  Patient Details  Name: Barbara Wilkins MRN: [id number] Date of Birth: 08-Apr-1946 Referring Provider (SLP): Junious Silk Date: 10/19/2020   End of Session - 10/20/20 1233    Visit Number 27    Number of Visits 49    Date for SLP Re-Evaluation 01/04/21    Authorization Type [employer]    Authorization Time Period 10/12/2020 thru 01/04/2021    Authorization - Visit Number 8    Progress Note Due on Visit 10    SLP Start Time 1100    SLP Stop Time  1200    SLP Time Calculation (min) 60 min    Activity Tolerance Patient tolerated treatment well           No past medical history on file.  No past surgical history on file.  There were no vitals filed for this visit.   Subjective Assessment - 10/20/20 1232    Subjective pt appeared in good spirits    Patient is accompained by: Family member    Currently in Pain? No/denies                 ADULT SLP TREATMENT - 10/20/20 0001      General Information   Behavior/Cognition Alert;Cooperative;Pleasant mood    HPI Pt is a 73 y/.o. patient of Dr Sherryll Burger who was referral for formal cognitive assessment and cognitive training d/t new diagnosis of dementia. 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 SLUMS done in office today was 10/30." MRI on 05/14/2020, which showed bilateral frontal and hippocampal atrophy, mild white matter microvascular ischemic and metabolic changes.       Treatment Provided    Treatment provided Cognitive-Linquistic      Pain Assessment   Pain Assessment No/denies pain      Cognitive-Linquistic Treatment   Treatment focused on Cognition;Patient/family/caregiver education    Skilled Treatment Introduced novel 100-piece jigsaw puzzle, pt independent with sorting into edge vs inside pieces, independently used appropriate pile of pieces for the edge, improved accuracy with placing pieces together on first try       Assessment / Recommendations / Plan   Plan Continue with current plan of care      Progression Toward Goals   Progression toward goals Progressing toward goals            SLP Education - 10/20/20 1233    Education Details strategies to increase efficency    Person(s) Educated Patient;Spouse    Methods Explanation;Demonstration;Verbal cues;Tactile cues    Comprehension Verbal cues required;Tactile cues required;Need further instruction            SLP Short Term Goals - 09/23/20 1438      SLP SHORT TERM GOAL #1   Status Achieved      SLP SHORT TERM GOAL #2   Status Achieved      SLP SHORT TERM GOAL #3   Status Achieved      SLP SHORT TERM GOAL #4   Title With minimal cues, pt will complete semi-complex cognitive training tasks that target preservation and  stmulation of cognitive functions.    Baseline Minimal assistance with basic cognitive tasks    Time 10    Period --   sessions   Status New      SLP SHORT TERM GOAL #5   Title Pt will demonstrate increased understanding of dementia by answering basic questions with 90% accuracy.    Baseline new goal    Time 10    Period --   sessions   Status New      Additional Short Term Goals   Additional Short Term Goals Yes      SLP SHORT TERM GOAL #6   Title Pt will demonstrate recall of basic novel information with 80% accuracy and minimal cues.    Baseline new goal    Time 10    Period --   sessions   Status New            SLP Long Term Goals - 10/13/20 1213      SLP LONG  TERM GOAL #1   Title Patient  and husband will identify cognitive-communication barriers and participate in developing functional compensatory strategies.    Status On-going      SLP LONG TERM GOAL #2   Title Patient will demonstrate functional cognitive-communication skills for independent completion of personal responsibilities and leisure activities.    Status On-going            Plan - 10/20/20 1234    Clinical Impression Statement Skilled treatment session targeted pt's cognition goals. SLP facilitated session by introducing new 100-piece jigsaw puzzle. Pt demonstrated carryover of previously learned strategies and required less SLP cues.  While pt demonstrates good accuracy, she is not efficient with piece selection. SLP introduced several strategies to increase efficiency.    Speech Therapy Frequency 2x / week    Duration 12 weeks    Treatment/Interventions Cognitive reorganization;SLP instruction and feedback;Internal/external aids;Cueing hierarchy;Functional tasks;Patient/family education    Potential to Achieve Goals Good    SLP Home Exercise Plan sort and complete edge of puzzle    Consulted and Agree with Plan of Care Patient;Family member/caregiver    Family Member Consulted pt's husband           Patient will benefit from skilled therapeutic intervention in order to improve the following deficits and impairments:   Cognitive communication deficit    Problem List There are no problems to display for this patient.  Lyndal Alamillo B. Dreama Saa M.S., CCC-SLP, [facility] Speech-Language Pathologist Rehabilitation Services Office [phone]  Reuel Derby 10/20/2020, 12:36 PM  [facility] MAIN [facility] SERVICES [address] [place], [place], [address] Phone: [phone]   Fax:  [phone]   Name: Barbara Wilkins MRN: [id number] Date of Birth: September 21, 1946

## 2020-10-21 ENCOUNTER — Other Ambulatory Visit: Payer: Self-pay

## 2020-10-21 ENCOUNTER — Ambulatory Visit: Payer: Medicare Other | Admitting: Speech Pathology

## 2020-10-21 DIAGNOSIS — R41841 Cognitive communication deficit: Secondary | ICD-10-CM

## 2020-10-22 NOTE — Therapy (Signed)
[facility] MAIN [facility] SERVICES [address] [place], [place], [address] Phone: [phone]   Fax:  [phone]  Speech Language Pathology Treatment  Patient Details  Name: Barbara Wilkins MRN: [id number] Date of Birth: [birth year] Referring Provider (SLP): Junious Silk Date: 10/21/2020   End of Session - 10/22/20 2218    Visit Number 28    Number of Visits 49    Date for SLP Re-Evaluation 01/04/21    Authorization Type [employer]    Authorization Time Period 10/12/2020 thru 01/04/2021    Authorization - Visit Number 9    Progress Note Due on Visit 10    SLP Start Time 1100    SLP Stop Time  1200    SLP Time Calculation (min) 60 min    Activity Tolerance Patient tolerated treatment well           No past medical history on file.  No past surgical history on file.  There were no vitals filed for this visit.   Subjective Assessment - 10/22/20 2215    Subjective pt appeared in good spirits    Patient is accompained by: Family member    Currently in Pain? No/denies                 ADULT SLP TREATMENT - 10/22/20 0001      General Information   Behavior/Cognition Alert;Cooperative;Pleasant mood    HPI Pt is a 73 y/.o. patient of Dr Sherryll Burger who was referral for formal cognitive assessment and cognitive training d/t new diagnosis of dementia. 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 SLUMS done in office today was 10/30." MRI on 05/14/2020, which showed bilateral frontal and hippocampal atrophy, mild white matter microvascular ischemic and metabolic changes.       Treatment Provided    Treatment provided Cognitive-Linquistic      Cognitive-Linquistic Treatment   Treatment focused on Cognition;Patient/family/caregiver education    Skilled Treatment Minimal cues to organize edge pieces, moderate faded to minimal cues to problem solve inside pieces in 100 comple jigsaw puzzle      Assessment / Recommendations / Plan   Plan Continue with current plan of care      Progression Toward Goals   Progression toward goals Progressing toward goals            SLP Education - 10/22/20 2217    Education Details cognition deficits that are being targeted by completing jigsaw puzzle    Person(s) Educated Patient;Spouse    Methods Explanation;Demonstration;Verbal cues    Comprehension Verbalized understanding;Returned demonstration            SLP Short Term Goals - 09/23/20 1438      SLP SHORT TERM GOAL #1   Status Achieved      SLP SHORT TERM GOAL #2   Status Achieved      SLP SHORT TERM GOAL #3   Status Achieved      SLP SHORT TERM GOAL #4   Title With minimal cues, pt will complete semi-complex cognitive training tasks that target preservation and stmulation of cognitive functions.    Baseline Minimal assistance with basic cognitive tasks    Time 10    Period --  sessions   Status New      SLP SHORT TERM GOAL #5   Title Pt will demonstrate increased understanding of dementia by answering basic questions with 90% accuracy.    Baseline new goal    Time 10    Period --   sessions   Status New      Additional Short Term Goals   Additional Short Term Goals Yes      SLP SHORT TERM GOAL #6   Title Pt will demonstrate recall of basic novel information with 80% accuracy and minimal cues.    Baseline new goal    Time 10    Period --   sessions   Status New            SLP Long Term Goals - 10/13/20 1213      SLP LONG TERM GOAL #1   Title Patient  and husband will identify cognitive-communication barriers and participate in developing functional  compensatory strategies.    Status On-going      SLP LONG TERM GOAL #2   Title Patient will demonstrate functional cognitive-communication skills for independent completion of personal responsibilities and leisure activities.    Status On-going            Plan - 10/22/20 2218    Clinical Impression Statement Skilled treatment session focused on pt's cognition goals. SLP facilitated session by providing decreased cues to sort and compete outside pieces in jigsaw puzzle. In addition, pt required much fewer cues to correcting join inside pieces. Specifically she demonstrates good progress in carryover and use of strategies that aid in problem solving.    Speech Therapy Frequency 2x / week    Duration 12 weeks    Treatment/Interventions Cognitive reorganization;SLP instruction and feedback;Internal/external aids;Cueing hierarchy;Functional tasks;Patient/family education    Potential to Achieve Goals Good    SLP Home Exercise Plan complete puzzle    Consulted and Agree with Plan of Care Patient;Family member/caregiver    Family Member Consulted pt's husband           Patient will benefit from skilled therapeutic intervention in order to improve the following deficits and impairments:   Cognitive communication deficit    Problem List There are no problems to display for this patient.  Willy Pinkerton B. Dreama Saa M.S., CCC-SLP, [facility] Speech-Language Pathologist Rehabilitation Services Office [phone]  Reuel Derby 10/22/2020, 10:23 PM  [facility] MAIN [facility] SERVICES [address] [place], [place], [address] Phone: [phone]   Fax:  [phone]   Name: Barbara Wilkins MRN: [id number] Date of Birth: 07/05/1946

## 2020-10-26 ENCOUNTER — Other Ambulatory Visit: Payer: Self-pay

## 2020-10-26 ENCOUNTER — Ambulatory Visit: Payer: Medicare Other | Admitting: Speech Pathology

## 2020-10-26 DIAGNOSIS — R41841 Cognitive communication deficit: Secondary | ICD-10-CM | POA: Diagnosis not present

## 2020-10-27 ENCOUNTER — Encounter: Payer: Self-pay | Admitting: Speech Pathology

## 2020-10-28 ENCOUNTER — Ambulatory Visit: Payer: Medicare Other | Admitting: Speech Pathology

## 2020-10-28 ENCOUNTER — Other Ambulatory Visit: Payer: Self-pay

## 2020-10-28 DIAGNOSIS — R41841 Cognitive communication deficit: Secondary | ICD-10-CM | POA: Diagnosis not present

## 2020-10-28 NOTE — Therapy (Signed)
[place] MAIN [facility] SERVICES [address] [place], [place], [address] Phone: [phone]   Fax:  [phone]  Speech Language Pathology Treatment Speech Therapy Progress Note   Dates of reporting period  09/21/2020   to   10/28/2020   Patient Details  Name: Barbara Wilkins MRN: [id number] Date of Birth: [birth year] Referring Provider (SLP): Vertell Novak Date: 10/28/2020   End of Session - 10/28/20 2300    Visit Number 30    Number of Visits 8    Date for SLP Re-Evaluation 01/04/21    Authorization Type [employer]    Authorization Time Period 10/12/2020 thru 01/04/2021    Authorization - Visit Number 10    Progress Note Due on Visit 10    SLP Start Time 1100    SLP Stop Time  1200    SLP Time Calculation (min) 60 min    Activity Tolerance Patient tolerated treatment well           No past medical history on file.  No past surgical history on file.  There were no vitals filed for this visit.   Subjective Assessment - 10/28/20 2256    Subjective pt states that she is feeling better    Patient is accompained by: Family member    Currently in Pain? No/denies                 ADULT SLP TREATMENT - 10/28/20 2257      General Information   Behavior/Cognition Alert;Cooperative;Pleasant mood    HPI Pt is a 29 y/.o. patient of Dr Manuella Ghazi who was referral for formal cognitive assessment and cognitive training d/t new diagnosis of dementia. 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 SLUMS done in office today was 10/30." MRI on 05/14/2020, which showed bilateral frontal and hippocampal  atrophy, mild white matter microvascular ischemic and metabolic changes.       Treatment Provided   Treatment provided Cognitive-Linquistic      Pain Assessment   Pain Assessment No/denies pain      Cognitive-Linquistic Treatment   Treatment focused on Cognition;Patient/family/caregiver education    Skilled Treatment independent with use of external aids to sort between outside and inside pieces; moderate assistance required to use details on pieces to help with judging which side the pieces should be on      Assessment / Recommendations / [place] with current plan of care      Progression Toward Goals   Progression toward goals Progressing toward goals            SLP Education - 10/28/20 2259    Education Details how to use details on puzzle pieces to help problem solve location of pieces    Person(s) Educated Patient;Spouse    Methods Explanation;Demonstration;Verbal cues    Comprehension Verbalized understanding;Returned demonstration            SLP Short Term Goals - 10/28/20 2302      SLP SHORT TERM GOAL #4   Title With minimal cues, pt will complete semi-complex cognitive training tasks that target preservation and stmulation of cognitive functions.    Status Partially Met      SLP SHORT  TERM GOAL #5   Title Pt will demonstrate increased understanding of dementia by answering basic questions with 90% accuracy.    Status Partially Met      SLP SHORT TERM GOAL #6   Title Pt will demonstrate recall of basic novel information with 80% accuracy and minimal cues.    Status Achieved            SLP Long Term Goals - 10/28/20 2303      SLP LONG TERM GOAL #1   Title Patient  and husband will identify cognitive-communication barriers and participate in developing functional compensatory strategies.    Status Partially Met      SLP LONG TERM GOAL #2   Title Patient will demonstrate functional cognitive-communication skills for independent completion of  personal responsibilities and leisure activities.    Status Partially Met            Plan - 10/28/20 2302    Clinical Impression Statement Skilled treatment session targeted pt's cognition goals. Pt stated that visual cues of sorting jigsaw pieces into piles helped a lot. Pt was independent in session with sorting pieces. With moderate assistance, pt able to look at edge pieces and based upon the details represented, she was able to determine which side of edge piece went on    Speech Therapy Frequency 2x / week    Duration 12 weeks    Treatment/Interventions Cognitive reorganization;SLP instruction and feedback;Internal/external aids;Cueing hierarchy;Functional tasks;Patient/family education    Potential to Achieve Goals Good    SLP Home Exercise Plan continue with jigsaw puzzle    Consulted and Agree with Plan of Care Patient;Family member/caregiver    Family Member Consulted pt's husband           Patient will benefit from skilled therapeutic intervention in order to improve the following deficits and impairments:   Cognitive communication deficit    Problem List There are no problems to display for this patient.  Shayra Anton B. Rutherford Nail M.S., CCC-SLP, [place] Pathologist Rehabilitation Services Office [phone]   [name] 10/28/2020, 11:04 PM  [place] MAIN [facility] SERVICES [address] [place], [place], [address] Phone: [phone]   Fax:  [phone]   Name: Barbara Wilkins MRN: [id number] Date of Birth: 12-05-1945

## 2020-10-28 NOTE — Therapy (Signed)
[facility] MAIN [facility] SERVICES [address] [place], [place], [address] Phone: [phone]   Fax:  [phone]  Speech Language Pathology Treatment  Patient Details  Name: Barbara Wilkins MRN: [id number] Date of Birth: October 10, 1946 Referring Provider (SLP): Junious Silk Date: 10/26/2020   End of Session - 10/28/20 1413    Visit Number 29    Number of Visits 49    Date for SLP Re-Evaluation 01/04/21    Authorization Type [employer]    Authorization Time Period 10/12/2020 thru 01/04/2021    Authorization - Visit Number 9    Progress Note Due on Visit 10    SLP Start Time 1100    SLP Stop Time  1200    SLP Time Calculation (min) 60 min    Activity Tolerance Patient tolerated treatment well           History reviewed. No pertinent past medical history.  History reviewed. No pertinent surgical history.  There were no vitals filed for this visit.   Subjective Assessment - 10/27/20 1611    Subjective pt appeared in good spirits, joking with this writer    Patient is accompained by: Family member    Currently in Pain? No/denies                 ADULT SLP TREATMENT - 10/28/20 0001      General Information   Behavior/Cognition Alert;Cooperative;Pleasant mood    HPI Pt is a 73 y/.o. patient of Dr Sherryll Burger who was referral for formal cognitive assessment and cognitive training d/t new diagnosis of dementia. 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 SLUMS done in office today was 10/30." MRI on 05/14/2020, which showed bilateral frontal and hippocampal atrophy, mild white matter microvascular  ischemic and metabolic changes.       Treatment Provided   Treatment provided Cognitive-Linquistic      Cognitive-Linquistic Treatment   Treatment focused on Cognition;Patient/family/caregiver education    Skilled Treatment decreased accuracy and effieceny when sorting edge pieces from inside pieces, improved with physical papers to sourt pieces on but continued to require minimal cues      Assessment / Recommendations / Plan   Plan Continue with current plan of care      Progression Toward Goals   Progression toward goals Progressing toward goals            SLP Education - 10/28/20 1412    Education Details how to utilize external cues to help with problem solving tasks    Person(s) Educated Patient;Spouse    Methods Explanation;Demonstration;Tactile cues;Verbal cues;Handout    Comprehension Verbalized understanding;Need further instruction            SLP Short Term Goals - 09/23/20 1438      SLP SHORT TERM GOAL #1   Status Achieved      SLP SHORT TERM GOAL #2   Status Achieved      SLP SHORT TERM GOAL #3   Status Achieved      SLP SHORT TERM GOAL #4   Title With minimal cues, pt will complete semi-complex cognitive training tasks that target preservation and stmulation of cognitive functions.    Baseline Minimal assistance with basic  cognitive tasks    Time 10    Period --   sessions   Status New      SLP SHORT TERM GOAL #5   Title Pt will demonstrate increased understanding of dementia by answering basic questions with 90% accuracy.    Baseline new goal    Time 10    Period --   sessions   Status New      Additional Short Term Goals   Additional Short Term Goals Yes      SLP SHORT TERM GOAL #6   Title Pt will demonstrate recall of basic novel information with 80% accuracy and minimal cues.    Baseline new goal    Time 10    Period --   sessions   Status New            SLP Long Term Goals - 10/13/20 1213      SLP LONG TERM GOAL #1   Title  Patient  and husband will identify cognitive-communication barriers and participate in developing functional compensatory strategies.    Status On-going      SLP LONG TERM GOAL #2   Title Patient will demonstrate functional cognitive-communication skills for independent completion of personal responsibilities and leisure activities.    Status On-going            Plan - 10/28/20 1606    Clinical Impression Statement Skilled treatment session targeted pt's cognition goals. Pt stated that visual cues of sorting jigsaw pieces into piles helped a lot. Pt was independent in session with sorting pieces. With moderate assistance, pt able to look at edge pieces and based upon the details represented, she was able to determine which side of edge piece went on.    Speech Therapy Frequency 2x / week    Duration 12 weeks    Treatment/Interventions Cognitive reorganization;SLP instruction and feedback;Internal/external aids;Cueing hierarchy;Functional tasks;Patient/family education    Potential to Achieve Goals Good    SLP Home Exercise Plan continue with jigsaw puzzle    Consulted and Agree with Plan of Care Patient;Family member/caregiver    Family Member Consulted pt's husband           Patient will benefit from skilled therapeutic intervention in order to improve the following deficits and impairments:   Cognitive communication deficit    Problem List There are no problems to display for this patient.  Royston Bekele B. Dreama Saa M.S., CCC-SLP, [facility] Speech-Language Pathologist Rehabilitation Services Office [phone]  Reuel Derby 10/28/2020, 5:24 PM  [place] [facility] MAIN [facility] SERVICES [address] [place], [place], [address] Phone: [phone]   Fax:  [phone]   Name: Barbara Wilkins MRN: [id number] Date of Birth: 1946/08/10

## 2020-11-02 ENCOUNTER — Other Ambulatory Visit: Payer: Self-pay

## 2020-11-02 ENCOUNTER — Ambulatory Visit: Payer: Medicare Other | Admitting: Speech Pathology

## 2020-11-02 DIAGNOSIS — R41841 Cognitive communication deficit: Secondary | ICD-10-CM

## 2020-11-03 NOTE — Therapy (Signed)
[place] MAIN [facility] SERVICES [address] [place], [place], [address] Phone: [phone]   Fax:  [phone]  Speech Language Pathology Treatment  Patient Details  Name: Barbara Wilkins MRN: [id number] Date of Birth: 1946-02-11 Referring Provider (SLP): Vertell Novak Date: 11/02/2020   End of Session - 11/03/20 1559    Visit Number 31    Number of Visits 63    Date for SLP Re-Evaluation 01/04/21    Authorization Type [employer]    Authorization Time Period 10/12/2020 thru 01/04/2021    Authorization - Visit Number 1    Progress Note Due on Visit 10    SLP Start Time 1100    SLP Stop Time  1200    SLP Time Calculation (min) 60 min    Activity Tolerance Patient tolerated treatment well           No past medical history on file.  No past surgical history on file.  There were no vitals filed for this visit.   Subjective Assessment - 11/03/20 1553    Subjective pt states that she is feeling better, appears in good spirits    Patient is accompained by: Family member    Currently in Pain? No/denies                 ADULT SLP TREATMENT - 11/03/20 0001      General Information   Behavior/Cognition Alert;Cooperative;Pleasant mood    HPI Pt is a 8 y/.o. patient of Dr Manuella Ghazi who was referral for formal cognitive assessment and cognitive training d/t new diagnosis of dementia. 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 SLUMS done in office today was 10/30." MRI on 05/14/2020, which showed bilateral frontal and hippocampal atrophy, mild white matter microvascular ischemic and metabolic changes.        Treatment Provided   Treatment provided Cognitive-Linquistic      Pain Assessment   Pain Assessment No/denies pain      Cognitive-Linquistic Treatment   Treatment focused on Cognition;Patient/family/caregiver education    Skilled Treatment Independent with use of visual sorting strategy even with new puzzle      Assessment / Recommendations / Plan   Plan Continue with current plan of care      Progression Toward Goals   Progression toward goals Progressing toward goals            SLP Education - 11/03/20 1558    Education Details using compensatory strategies to help increase accuracy with tasks    Person(s) Educated Patient;Spouse    Methods Explanation;Demonstration;Verbal cues    Comprehension Verbalized understanding;Need further instruction            SLP Short Term Goals - 10/28/20 2302      SLP SHORT TERM GOAL #4   Title With minimal cues, pt will complete semi-complex cognitive training tasks that target preservation and stmulation of cognitive functions.    Status Partially Met      SLP SHORT TERM GOAL #5   Title Pt will demonstrate increased understanding of dementia by answering basic questions with 90% accuracy.    Status Partially Met      SLP SHORT TERM GOAL #6   Title Pt  will demonstrate recall of basic novel information with 80% accuracy and minimal cues.    Status Achieved            SLP Long Term Goals - 10/28/20 2303      SLP LONG TERM GOAL #1   Title Patient  and husband will identify cognitive-communication barriers and participate in developing functional compensatory strategies.    Status Partially Met      SLP LONG TERM GOAL #2   Title Patient will demonstrate functional cognitive-communication skills for independent completion of personal responsibilities and leisure activities.    Status Partially Met            Plan - 11/03/20 1600    Clinical Impression Statement Skilled treatment session targeted pt's cognition. SLP  facilitated session by having pt use visual aids to sort a novel puzzle. Pt was independent with use of aids and also independently self-corrected x 2 for independent accuracy when completing task.    Speech Therapy Frequency 2x / week    Duration 12 weeks    Treatment/Interventions Cognitive reorganization;SLP instruction and feedback;Internal/external aids;Cueing hierarchy;Functional tasks;Patient/family education    Potential to Achieve Goals Good    Consulted and Agree with Plan of Care Patient;Family member/caregiver    Family Member Consulted pt's husband           Patient will benefit from skilled therapeutic intervention in order to improve the following deficits and impairments:   Cognitive communication deficit    Problem List There are no problems to display for this patient.  Dai Apel B. Rutherford Nail M.S., CCC-SLP, [place] Pathologist Rehabilitation Services Office [phone]  [name] 11/03/2020, 4:04 PM  [place] MAIN [facility] SERVICES [address] [place], [place], [address] Phone: [phone]   Fax:  [phone]   Name: Barbara Wilkins MRN: [id number] Date of Birth: [birth year]

## 2020-11-04 ENCOUNTER — Ambulatory Visit: Payer: Medicare Other | Admitting: Speech Pathology

## 2020-11-04 ENCOUNTER — Other Ambulatory Visit: Payer: Self-pay

## 2020-11-04 DIAGNOSIS — R41841 Cognitive communication deficit: Secondary | ICD-10-CM

## 2020-11-04 NOTE — Therapy (Signed)
[place] MAIN [facility] SERVICES [address] [place], [place], [address] Phone: [phone]   Fax:  [phone]  Speech Language Pathology Treatment  Patient Details  Name: Barbara Wilkins MRN: [id number] Date of Birth: [birth year] Referring Provider (SLP): Vertell Novak Date: 11/04/2020   End of Session - 11/04/20 1526    Visit Number 32    Number of Visits 75    Date for SLP Re-Evaluation 01/04/21    Authorization Type [employer]    Authorization Time Period 10/12/2020 thru 01/04/2021    Authorization - Visit Number 2    Progress Note Due on Visit 10    SLP Start Time 1100    SLP Stop Time  1200    SLP Time Calculation (min) 60 min    Activity Tolerance Patient tolerated treatment well           No past medical history on file.  No past surgical history on file.  There were no vitals filed for this visit.   Subjective Assessment - 11/04/20 1524    Subjective pt states that she is feeling better, appears in good spirits    Patient is accompained by: Family member    Currently in Pain? No/denies                 ADULT SLP TREATMENT - 11/04/20 0001      General Information   Behavior/Cognition Alert;Cooperative;Pleasant mood    HPI Pt is a 53 y/.o. patient of Dr Manuella Ghazi who was referral for formal cognitive assessment and cognitive training d/t new diagnosis of dementia. 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 SLUMS done in office today was 10/30." MRI on 05/14/2020, which showed bilateral frontal and hippocampal atrophy, mild white matter microvascular ischemic and metabolic changes.        Treatment Provided   Treatment provided Cognitive-Linquistic      Pain Assessment   Pain Assessment No/denies pain      Cognitive-Linquistic Treatment   Treatment focused on Cognition;Patient/family/caregiver education    Skilled Treatment Independently answered uestions about past reading assignments with 81% accuracy; much improved problem solving abilities with novel board game      Assessment / Recommendations / [place] with current plan of care      Progression Toward Goals   Progression toward goals Progressing toward goals            SLP Education - 11/04/20 1525    Education Details retention of previously learned information,    Person(s) Educated Patient;Spouse    Methods Explanation;Demonstration;Verbal cues    Comprehension Verbalized understanding;Returned demonstration            SLP Short Term Goals - 10/28/20 2302      SLP SHORT TERM GOAL #4   Title With minimal cues, pt will complete semi-complex cognitive training tasks that target preservation and stmulation of cognitive functions.    Status Partially Met      SLP SHORT TERM GOAL #5   Title Pt will demonstrate increased understanding of dementia by answering basic questions with 90% accuracy.    Status Partially Met      SLP SHORT TERM GOAL #6  Title Pt will demonstrate recall of basic novel information with 80% accuracy and minimal cues.    Status Achieved            SLP Long Term Goals - 10/28/20 2303      SLP LONG TERM GOAL #1   Title Patient  and husband will identify cognitive-communication barriers and participate in developing functional compensatory strategies.    Status Partially Met      SLP LONG TERM GOAL #2   Title Patient will demonstrate functional cognitive-communication skills for independent completion of personal responsibilities and leisure activities.    Status Partially Met            Plan - 11/04/20 1526    Clinical Impression Statement Skilled  treatment session focused on pt's cognition goals. SLP facilitated session by assessing pt's ability to retain information previously learned from reading materials. SLP presented basic to complex mixture of questions involving content from 3 books ([employer], [employer], [employer]). Pt independently answered 81% of all questions accurately. Pt pleased with progress. Pt and her husband also provided an example of pt doing activities for herself, such as calling her DIL for a recipe instead of her husband calling. SLP further facilitated session by introducing novel board game (Checkers). Pt had never played game. Pt demonstrated immediate learning and also asked intuitive questions about movements. This is also great progress as pt is demonstrating decreased time needed to effectively problem solve.    Speech Therapy Frequency 2x / week    Duration 12 weeks    Treatment/Interventions Cognitive reorganization;SLP instruction and feedback;Internal/external aids;Cueing hierarchy;Functional tasks;Patient/family education    Potential to Achieve Goals Good    SLP Home Exercise Plan play checkers at home    Consulted and Agree with Plan of Care Patient;Family member/caregiver    Family Member Consulted pt's husband           Patient will benefit from skilled therapeutic intervention in order to improve the following deficits and impairments:   Cognitive communication deficit    Problem List There are no problems to display for this patient.  Barbara Wilkins B. Rutherford Nail M.S., CCC-SLP, [place] Pathologist Rehabilitation Services Office [phone]  Barbara Wilkins 11/04/2020, 3:32 PM  [place] MAIN [facility] SERVICES [address] [place], [place], [address] Phone: [phone]   Fax:  [phone]   Name: Barbara Wilkins MRN: [id number] Date of Birth: 1946-09-28

## 2020-11-16 ENCOUNTER — Other Ambulatory Visit: Payer: Self-pay

## 2020-11-16 ENCOUNTER — Ambulatory Visit: Payer: Medicare Other | Attending: Neurology | Admitting: Speech Pathology

## 2020-11-16 DIAGNOSIS — R41841 Cognitive communication deficit: Secondary | ICD-10-CM | POA: Insufficient documentation

## 2020-11-17 NOTE — Therapy (Signed)
[place] MAIN [facility] SERVICES [address] [place], [place], [address] Phone: [phone]   Fax:  [phone]  Speech Language Pathology Treatment  Patient Details  Name: Barbara Wilkins MRN: [id number] Date of Birth: 1945-11-17 Referring Provider (SLP): Vertell Novak Date: 11/16/2020   End of Session - 11/17/20 1559    Visit Number 33    Number of Visits 58    Date for SLP Re-Evaluation 01/04/21    Authorization Type [employer]    Authorization Time Period 10/12/2020 thru 01/04/2021    Authorization - Visit Number 3    Progress Note Due on Visit 10    SLP Start Time 1100    SLP Stop Time  1200    SLP Time Calculation (min) 60 min    Activity Tolerance Patient tolerated treatment well           No past medical history on file.  No past surgical history on file.  There were no vitals filed for this visit.   Subjective Assessment - 11/17/20 0744    Subjective pt reports being slightly fatigued by family situation    Patient is accompained by: Family member    Currently in Pain? No/denies                 ADULT SLP TREATMENT - 11/17/20 0001      General Information   Behavior/Cognition Alert;Cooperative;Pleasant mood    HPI Pt is a 28 y/.o. patient of Dr Manuella Ghazi who was referral for formal cognitive assessment and cognitive training d/t new diagnosis of dementia. 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 SLUMS done in office today was 10/30." MRI on 05/14/2020, which showed bilateral frontal and hippocampal atrophy, mild white matter microvascular ischemic and metabolic changes.        Treatment Provided   Treatment provided Cognitive-Linquistic      Pain Assessment   Pain Assessment No/denies pain      Cognitive-Linquistic Treatment   Treatment focused on Cognition;Patient/family/caregiver education    Skilled Treatment Supervision cues to problem solve visual design task      Assessment / Recommendations / Plan   Plan Continue with current plan of care      Progression Toward Goals   Progression toward goals Progressing toward goals            SLP Education - 11/17/20 1558    Education Details use of strategies when attacking problems    Person(s) Educated Patient;Spouse    Methods Explanation;Demonstration;Verbal cues    Comprehension Verbalized understanding;Returned demonstration            SLP Short Term Goals - 10/28/20 2302      SLP SHORT TERM GOAL #4   Title With minimal cues, pt will complete semi-complex cognitive training tasks that target preservation and stmulation of cognitive functions.    Status Partially Met      SLP SHORT TERM GOAL #5   Title Pt will demonstrate increased understanding of dementia by answering basic questions with 90% accuracy.    Status Partially Met      SLP SHORT TERM GOAL #6   Title Pt will demonstrate recall of basic novel information with 80% accuracy  and minimal cues.    Status Achieved            SLP Long Term Goals - 10/28/20 2303      SLP LONG TERM GOAL #1   Title Patient  and husband will identify cognitive-communication barriers and participate in developing functional compensatory strategies.    Status Partially Met      SLP LONG TERM GOAL #2   Title Patient will demonstrate functional cognitive-communication skills for independent completion of personal responsibilities and leisure activities.    Status Partially Met            Plan - 11/17/20 1603    Clinical Impression Statement Skilled treatment session focused on pt's cognition goals, specifically using strategies to increase  problem solving abilities. Pt and her husband state that pt continues to feel better about herself and is attempting more tasks independently at home. They provide they are enjoying checkers and her grandsons gave her a photo puzzle for Christmas. SLP facilitated session by providing superivsion level cues to effectively problem solve novel visual design generation task. She is able to generate correct answers but does take an extended time.    Speech Therapy Frequency 2x / week    Duration 12 weeks    Treatment/Interventions Cognitive reorganization;SLP instruction and feedback;Internal/external aids;Cueing hierarchy;Functional tasks;Patient/family education    Potential to Achieve Goals Good    SLP Home Exercise Plan provided    Consulted and Agree with Plan of Care Patient;Family member/caregiver    Family Member Consulted pt's husband           Patient will benefit from skilled therapeutic intervention in order to improve the following deficits and impairments:   Cognitive communication deficit    Problem List There are no problems to display for this patient.  Dray Dente B. Rutherford Nail M.S., CCC-SLP, [place] Pathologist Rehabilitation Services Office [phone]  [name] 11/17/2020, 4:10 PM  [place] MAIN [facility] SERVICES [address] [place], [place], [address] Phone: [phone]   Fax:  [phone]   Name: Barbara Wilkins MRN: [id number] Date of Birth: [birth year]

## 2020-11-18 ENCOUNTER — Ambulatory Visit: Payer: Medicare Other | Admitting: Speech Pathology

## 2020-11-23 ENCOUNTER — Ambulatory Visit: Payer: Medicare Other | Admitting: Speech Pathology

## 2020-11-25 ENCOUNTER — Ambulatory Visit: Payer: Medicare Other | Admitting: Speech Pathology

## 2020-11-30 ENCOUNTER — Ambulatory Visit: Payer: Medicare Other | Admitting: Speech Pathology

## 2020-12-02 ENCOUNTER — Ambulatory Visit: Payer: Medicare Other | Admitting: Speech Pathology

## 2020-12-07 ENCOUNTER — Ambulatory Visit: Payer: Medicare Other | Admitting: Speech Pathology

## 2020-12-07 ENCOUNTER — Other Ambulatory Visit: Payer: Self-pay

## 2020-12-07 DIAGNOSIS — R41841 Cognitive communication deficit: Secondary | ICD-10-CM

## 2020-12-08 NOTE — Therapy (Signed)
[place] MAIN [facility] SERVICES [address] [place], [place], [address] Phone: [phone]   Fax:  [phone]  Speech Language Pathology Treatment  Patient Details  Name: Barbara Wilkins MRN: [id number] Date of Birth: 1946-05-20 Referring Provider (SLP): Barbara Wilkins Date: 12/07/2020   End of Session - 12/08/20 1234    Visit Number 34    Number of Visits 33    Date for SLP Re-Evaluation 01/04/21    Authorization Type [employer]    Authorization Time Period 10/12/2020 thru 01/04/2021    Authorization - Visit Number 4    Progress Note Due on Visit 10    SLP Start Time 1110    SLP Stop Time  1205    SLP Time Calculation (min) 55 min    Activity Tolerance Patient tolerated treatment well           No past medical history on file.  No past surgical history on file.  There were no vitals filed for this visit.   Subjective Assessment - 12/08/20 1036    Subjective Pt reports feeling very discouraged    Patient is accompained by: Family member    Currently in Pain? No/denies                 ADULT SLP TREATMENT - 12/08/20 0001      General Information   Behavior/Cognition Alert;Cooperative;Pleasant mood   feels discouraged   HPI Pt is a 50 y/.o. patient of Dr Barbara Wilkins who was referral for formal cognitive assessment and cognitive training d/t new diagnosis of dementia. 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 SLUMS done in office today was 10/30." MRI on 05/14/2020, which showed bilateral frontal and hippocampal atrophy, mild white matter microvascular ischemic and metabolic changes.        Treatment Provided   Treatment provided Cognitive-Linquistic      Pain Assessment   Pain Assessment No/denies pain      Cognitive-Linquistic Treatment   Treatment focused on Cognition;Patient/family/caregiver education    Skilled Treatment Pt has missed 3 weeks of therapy d/t illness and therapist absence. Pt feels like she has lost ability during this time. Therefore SLP provided tasks that pt has not completed in the last 2 months to encouarge pt that she continues to retain abilities.      Assessment / Recommendations / Plan   Plan Continue with current plan of care      Progression Toward Goals   Progression toward goals Progressing toward goals            SLP Education - 12/08/20 1233    Education Details no loss in progression or retention of information    Person(s) Educated Patient;Spouse    Methods Explanation;Demonstration;Verbal cues;Handout    Comprehension Verbalized understanding;Returned demonstration;Need further instruction            SLP Short Term Goals - 10/28/20 2302      SLP SHORT TERM GOAL #4   Title With minimal cues, pt will complete semi-complex cognitive training tasks that target preservation and stmulation of cognitive functions.    Status Partially Met      SLP SHORT TERM GOAL #5   Title Pt will demonstrate  increased understanding of dementia by answering basic questions with 90% accuracy.    Status Partially Met      SLP SHORT TERM GOAL #6   Title Pt will demonstrate recall of basic novel information with 80% accuracy and minimal cues.    Status Achieved            SLP Long Term Goals - 10/28/20 2303      SLP LONG TERM GOAL #1   Title Patient  and husband will identify cognitive-communication barriers and participate in developing functional compensatory strategies.    Status Partially Met      SLP LONG TERM GOAL #2   Title Patient will demonstrate functional cognitive-communication skills for independent completion of personal  responsibilities and leisure activities.    Status Partially Met            Plan - 12/08/20 1234    Clinical Impression Statement Skilled treatment session focused on pt's cognitive communicaiton goals. SLP faciltiated session by providing previous tasks that pt has not interacted with for at least 2 months. When answering [facility] about Solitaire, pt able to answer independently with 72% accuracy. Additionally, pt used left to right scanning and even though she was concerned that she would not remember the game, she didn't shut down like she had previously done with starting ST services. Pt was also able to answer questions about the 3 books she read with 77% accuracy. Pt will need re-iteration of progress in future sessions.    Speech Therapy Frequency 2x / week    Duration 12 weeks    Treatment/Interventions Cognitive reorganization;SLP instruction and feedback;Internal/external aids;Cueing hierarchy;Functional tasks;Patient/family education    Potential to Achieve Goals Good    SLP Home Exercise Plan provided    Consulted and Agree with Plan of Care Patient;Family member/caregiver    Family Member Consulted pt's husband           Patient will benefit from skilled therapeutic intervention in order to improve the following deficits and impairments:   Cognitive communication deficit    Problem List There are no problems to display for this patient.  Barbara Wilkins B. Rutherford Nail M.S., CCC-SLP, [place] Pathologist Rehabilitation Services Office [phone]  [name] 12/08/2020, 12:39 PM  [place] MAIN [facility] SERVICES [address] [place], [place], [address] Phone: [phone]   Fax:  [phone]   Name: Barbara Wilkins MRN: [id number] Date of Birth: 1946-09-28

## 2020-12-09 ENCOUNTER — Other Ambulatory Visit: Payer: Self-pay

## 2020-12-09 ENCOUNTER — Ambulatory Visit: Payer: Medicare Other | Admitting: Speech Pathology

## 2020-12-09 DIAGNOSIS — R41841 Cognitive communication deficit: Secondary | ICD-10-CM | POA: Diagnosis not present

## 2020-12-09 NOTE — Patient Instructions (Signed)
Fill out the following information per day in preparation for neurology appt.  Date: ______________   Time migraine started: _______________________   Rate intensity of migraine (1-10) before you take medicine: ___________________________________   What medicine did you take/when did you take it (list specifics): _______________________________        ___________________________________________________   Rate intensity of migraine (1-10) after taking the above medicines: _____________________________   How long did the migraine last?: _________________________________________________________     Also fill out this form for information prior to GI appt.  Date: ______________   Do you have a bowel movement today? ___________________________   Do you take anything to have a bowel movement today, if so what and what was the result?   ___________________________________________________________________________   If you have a bowel movement today, describe the texture, amount and color.   ___________________________________________________________________________

## 2020-12-09 NOTE — Therapy (Signed)
[place] MAIN [facility] SERVICES [address] [place], [place], [address] Phone: [phone]   Fax:  [phone]  Speech Language Pathology Treatment  Patient Details  Name: Barbara Wilkins MRN: [id number] Date of Birth: February 16, 1946 Referring Provider (SLP): Vertell Novak Date: 12/09/2020   End of Session - 12/09/20 1746    Visit Number 35    Number of Visits 80    Date for SLP Re-Evaluation 01/04/21    Authorization Type [employer]    Authorization Time Period 10/12/2020 thru 01/04/2021    Authorization - Visit Number 5    Progress Note Due on Visit 10    SLP Start Time 1100    SLP Stop Time  1130    SLP Time Calculation (min) 30 min    Activity Tolerance Patient tolerated treatment well           No past medical history on file.  No past surgical history on file.  There were no vitals filed for this visit.   Subjective Assessment - 12/09/20 1741    Subjective pt expereiencing a debilitating migraine    Patient is accompained by: Family member    Pain Score 8     Pain Location Head                 ADULT SLP TREATMENT - 12/09/20 0001      General Information   Behavior/Cognition Alert    HPI Pt is a 77 y/.o. patient of Dr Manuella Ghazi who was referral for formal cognitive assessment and cognitive training d/t new diagnosis of dementia. 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 SLUMS done in office today was 10/30." MRI on 05/14/2020, which showed bilateral frontal and hippocampal atrophy, mild white matter microvascular ischemic and metabolic changes.       Treatment Provided   Treatment  provided Cognitive-Linquistic      Pain Assessment   Pain Assessment 0-10    Pain Score 8     Pain Location headache      Cognitive-Linquistic Treatment   Treatment focused on Cognition;Patient/family/caregiver education    Skilled Treatment Pt is planning to schedule a follow with neurology regarding consistent debilitating migraines. She is not able to effectively organize her thoughts or able to keep up with how effective medication is in treating them. Plan made to journal details.      Assessment / Recommendations / Plan   Plan Continue with current plan of care      Progression Toward Goals   Progression toward goals Progressing toward goals            SLP Education - 12/09/20 1746    Education Details provided    Person(s) Educated Patient;Spouse    Methods Explanation;Handout    Comprehension Verbalized understanding            SLP Short Term Goals - 10/28/20 2302      SLP SHORT TERM GOAL #4   Title With minimal cues, pt will complete semi-complex cognitive training tasks that target preservation and stmulation of cognitive functions.    Status Partially Met      SLP SHORT TERM GOAL #5   Title Pt will demonstrate increased understanding  of dementia by answering basic questions with 90% accuracy.    Status Partially Met      SLP SHORT TERM GOAL #6   Title Pt will demonstrate recall of basic novel information with 80% accuracy and minimal cues.    Status Achieved            SLP Long Term Goals - 10/28/20 2303      SLP LONG TERM GOAL #1   Title Patient  and husband will identify cognitive-communication barriers and participate in developing functional compensatory strategies.    Status Partially Met      SLP LONG TERM GOAL #2   Title Patient will demonstrate functional cognitive-communication skills for independent completion of personal responsibilities and leisure activities.    Status Partially Met            Plan - 12/09/20 1747    Clinical  Impression Statement In an effort to help pt and her husband effective organize and communication information about her migraines to neurology, form was created to complete with details likely needed for appt.    Speech Therapy Frequency 2x / week    Duration 12 weeks    Treatment/Interventions Cognitive reorganization;SLP instruction and feedback;Internal/external aids;Cueing hierarchy;Functional tasks;Patient/family education    Potential to Achieve Goals Good    SLP Home Exercise Plan provided    Consulted and Agree with Plan of Care Patient;Family member/caregiver    Family Member Consulted pt's husband           Patient will benefit from skilled therapeutic intervention in order to improve the following deficits and impairments:   Cognitive communication deficit    Problem List There are no problems to display for this patient.   Gaspare Netzel Rutherford Nail 12/09/2020, 5:48 PM  [place] MAIN [facility] SERVICES [address] [place], [place], [address] Phone: [phone]   Fax:  [phone]   Name: Barbara Wilkins MRN: [id number] Date of Birth: [birth year]

## 2020-12-14 ENCOUNTER — Ambulatory Visit: Payer: Medicare Other | Admitting: Speech Pathology

## 2020-12-16 ENCOUNTER — Ambulatory Visit: Payer: Medicare Other | Admitting: Speech Pathology

## 2020-12-21 ENCOUNTER — Other Ambulatory Visit: Payer: Self-pay

## 2020-12-21 ENCOUNTER — Ambulatory Visit: Payer: Medicare Other | Attending: Neurology | Admitting: Speech Pathology

## 2020-12-21 DIAGNOSIS — R41841 Cognitive communication deficit: Secondary | ICD-10-CM | POA: Diagnosis present

## 2020-12-22 NOTE — Therapy (Signed)
[place] MAIN [facility] SERVICES [address] [place], [place], [address] Phone: [phone]   Fax:  [phone]  Speech Language Pathology Treatment  Patient Details  Name: Barbara Wilkins MRN: [id number] Date of Birth: January 23, 1946 Referring Provider (SLP): Vertell Novak Date: 12/21/2020   End of Session - 12/21/20 1756    Visit Number 36    Number of Visits 59    Date for SLP Re-Evaluation 01/04/21    Authorization Type [employer]    Authorization Time Period 10/12/2020 thru 01/04/2021    Authorization - Visit Number 6    Progress Note Due on Visit 10    SLP Start Time 1100    SLP Stop Time  1205    SLP Time Calculation (min) 65 min    Activity Tolerance Patient tolerated treatment well             Subjective Assessment - 12/21/20 1749    Subjective pt pleasant, struggled with understanding instructions of at-home memory task    Patient is accompained by: Family member    Currently in Pain? No/denies           Neuro   ST   TX   NOTE                Treatment Data and Patient's Response to Treatment    Skilled treatment session focused on pt's cognitive communicaiton goals. SLP faciltiated session by providing pt with new card game that required patient to identify and match objects. Patient correctly matched all objects independently; however, she required additional processing time to improve accuracy in 9 out of 29 opportunities. Moderate to  maximal assistance in identifying and sorting pieces when completing a puzzle were required to improve accuracy.    SLP also facilitated session by providing ways for pt to practice short-term memory such as remembering items to get from pantry. During this conversation, Barbara Wilkins appeared very confused. This is likely related to the severity of her migraine.   Skilled ST intervention is required to targeted increased cognitive function to increase functional independence,  persevere cognitive function and reduce caregiver burden.         SLP Education - 12/21/20 1755    Education Details Education regarding memory tasks that can be completed at home    Person(s) Educated Patient;Spouse    Methods Explanation    Comprehension Verbalized understanding            SLP Short Term Goals - 10/28/20 2302      SLP SHORT TERM GOAL #4   Title With minimal cues, pt will complete semi-complex cognitive training tasks that target preservation and stmulation of cognitive functions.    Status Partially Met      SLP SHORT TERM GOAL #5   Title Pt will demonstrate increased understanding of dementia by answering basic questions with 90% accuracy.    Status Partially Met      SLP SHORT TERM GOAL #6   Title Pt will demonstrate recall of basic novel information with 80% accuracy and minimal cues.    Status Achieved            SLP Long Term Goals - 10/28/20 2303      SLP LONG TERM GOAL #1   Title Patient  and husband will identify cognitive-communication barriers and participate in developing functional compensatory strategies.    Status Partially Met      SLP LONG TERM GOAL #2  Title Patient will demonstrate functional cognitive-communication skills for independent completion of personal responsibilities and leisure activities.    Status Partially Met            Plan - 12/21/20 1805        Speech Therapy Frequency 2x / week    Duration 12 weeks    Treatment/Interventions Cognitive reorganization;SLP instruction and feedback;Internal/external aids;Cueing hierarchy;Functional tasks;Patient/family education    Potential to Achieve Goals Good    Potential Considerations Ability to learn/carryover information    SLP Home Exercise Plan provided    Consulted and Agree with Plan of Care Patient;Family member/caregiver    Family Member Consulted pt's husband           Patient will benefit from skilled therapeutic intervention in order to improve the  following deficits and impairments:   Cognitive communication deficit    Problem List There are no problems to display for this patient.  Edward Trevino B. Rutherford Nail M.S., CCC-SLP, [place] Pathologist Rehabilitation Services Office [phone]  [name] 12/22/2020, 2:44 PM  [place] MAIN [facility] SERVICES [address] [place], [place], [address] Phone: [phone]   Fax:  [phone]   Name: Barbara Wilkins MRN: [id number] Date of Birth: [birth year]

## 2020-12-23 ENCOUNTER — Ambulatory Visit: Payer: Medicare Other | Admitting: Speech Pathology

## 2020-12-23 ENCOUNTER — Other Ambulatory Visit: Payer: Self-pay

## 2020-12-23 DIAGNOSIS — R41841 Cognitive communication deficit: Secondary | ICD-10-CM | POA: Diagnosis not present

## 2020-12-25 NOTE — Therapy (Signed)
[place] MAIN [facility] SERVICES [address] [place], [place], [address] Phone: [phone]   Fax:  [phone]  Speech Language Pathology Treatment  Patient Details  Name: Barbara Wilkins MRN: [id number] Date of Birth: 1946-09-05 Referring Provider (SLP): Vertell Novak Date: 12/23/2020   End of Session - 12/25/20 0821    Visit Number 37    Number of Visits 79    Date for SLP Re-Evaluation 01/04/21    Authorization Type [employer]    Authorization Time Period 10/12/2020 thru 01/04/2021    Authorization - Visit Number 7    Progress Note Due on Visit 10    SLP Start Time 1100    SLP Stop Time  1200    SLP Time Calculation (min) 60 min    Activity Tolerance Patient tolerated treatment well           No past medical history on file.  No past surgical history on file.  There were no vitals filed for this visit.   Subjective Assessment - 12/25/20 0820    Subjective pt continues to struggle with migraines - she states "I am here"    Patient is accompained by: Family member    Currently in Pain? No/denies             Neuro   ST   TX   NOTE          Treatment Data and Patient's Response to Treatment   Skilled treatment session targeted pt's cognition goals. Pt arrived to session with her husband and when asked how she was feeling, pt stated "I am here" indicating continued struggle with migraines.  Pt and her husband report good success with pt recalling ingredients for supper and retrieving them from pantry. SLP facilitated session by teaching pt novel semi-complex game (UNO). After instruction pt began demonstrating good ability to play game with only intermittent minimal cues for problem solving. She also asked appropriate question indicating increased comprehension of game. SLP further facilitated session by engaging pt in a reading exercise of the pre-reader entitled "WEATHER." Pt will engage in reading  information to demonstrate recall during next session. She was  75% accurate when recalling information from previously read books.   While pt continues demonstrating ability to increase her memory and new learning, She continues to demonstrate mild to moderate impairments in cognitive communication skills and is living a passive life as a result. Skilled ST intervention is required to increase functional independence thru targeting pt's ability to problem solve and recall information thereby reducing caregiver burden.       SLP Education - 12/25/20 [phone]    Education Details provided    Person(s) Educated Patient;Spouse    Methods Explanation;Demonstration;Verbal cues;Handout    Comprehension Verbalized understanding;Returned demonstration;Need further instruction            SLP Short Term Goals - 10/28/20 2302      SLP SHORT TERM GOAL #4   Title With minimal cues, pt will complete semi-complex cognitive training tasks that target preservation and stmulation of cognitive functions.    Status Partially Met      SLP SHORT TERM GOAL #5   Title Pt will demonstrate increased understanding of dementia by answering basic questions with 90% accuracy.    Status Partially Met      SLP SHORT TERM GOAL #6   Title Pt will demonstrate recall of basic novel information with 80% accuracy and minimal cues.  Status Achieved            SLP Long Term Goals - 10/28/20 2303      SLP LONG TERM GOAL #1   Title Patient  and husband will identify cognitive-communication barriers and participate in developing functional compensatory strategies.    Status Partially Met      SLP LONG TERM GOAL #2   Title Patient will demonstrate functional cognitive-communication skills for independent completion of personal responsibilities and leisure activities.    Status Partially Met            Plan - 12/25/20 0822    Speech Therapy Frequency 2x / week    Duration 12 weeks    Treatment/Interventions  Cognitive reorganization;SLP instruction and feedback;Internal/external aids;Cueing hierarchy;Functional tasks;Patient/family education    Potential to Achieve Goals Good    Potential Considerations Ability to learn/carryover information    SLP Home Exercise Plan provided    Consulted and Agree with Plan of Care Patient;Family member/caregiver    Family Member Consulted pt's husband           Patient will benefit from skilled therapeutic intervention in order to improve the following deficits and impairments:   Cognitive communication deficit    Problem List There are no problems to display for this patient.  Sela Falk B. Rutherford Nail M.S., CCC-SLP, [place] Pathologist Rehabilitation Services Office [phone]   [name] 12/25/2020, 8:23 AM  [place] MAIN [facility] SERVICES [address] [place], [place], [address] Phone: [phone]   Fax:  [phone]   Name: Barbara Wilkins MRN: [id number] Date of Birth: Jun 08, 1946

## 2020-12-25 NOTE — Patient Instructions (Signed)
Read book on WEATHER

## 2020-12-28 ENCOUNTER — Other Ambulatory Visit: Payer: Self-pay

## 2020-12-28 ENCOUNTER — Ambulatory Visit: Payer: Medicare Other | Admitting: Speech Pathology

## 2020-12-28 DIAGNOSIS — R41841 Cognitive communication deficit: Secondary | ICD-10-CM

## 2020-12-28 NOTE — Therapy (Signed)
[place] MAIN [facility] SERVICES [address] [place], [place], [address] Phone: [phone]   Fax:  [phone]  Speech Language Pathology Treatment  Patient Details  Name: Barbara Wilkins MRN: [id number] Date of Birth: 08/28/1946 Referring Provider (SLP): Vertell Novak Date: 12/28/2020   End of Session - 12/28/20 1250    Visit Number 38    Number of Visits 8    Date for SLP Re-Evaluation 01/04/21    Authorization Type [employer]    Authorization Time Period 10/12/2020 thru 01/04/2021    Authorization - Visit Number 8    Progress Note Due on Visit 10    SLP Start Time 1100    SLP Stop Time  1200    SLP Time Calculation (min) 60 min    Activity Tolerance Patient tolerated treatment well           No past medical history on file.  No past surgical history on file.  There were no vitals filed for this visit.   Subjective Assessment - 12/28/20 1247    Subjective pt states "not good" when asked how she is d/t continued migraines    Patient is accompained by: Family member    Currently in Pain? No/denies            Neuro   ST   TX   NOTE          Treatment Data and Patient's Response to Treatment   Skilled treatment session targeted pt's cognition goals. Pt was accompanied by husband to session. When asked how she was doing, pt stated "not good" referring to continued migraines. SLP facilitated session by providing pt with card game "Spot It" that required pt to identify and match objects. When asking pt if she had played the game with the new set of cards, pt unable to recall previously playing with husband at home. Pt independently matched all objects correctly; however additional processing time was required. Using the same card game, SLP introduced new problem solving game that required pt to identify and match objects with a time constraint against one other player. Maximal assistance in recall of game rules  and identifying and matching objects was required.  Problem solving strategies were therefore introduced with moderate cueing in identifying and matching objects which led to improvement in pt's accuracy and reduction of processing time. Skilled ST intervention is required to target increased cognitive function to therefore increase functional independence, persevere cognitive function and reduce caregiver burden.     ADULT SLP TREATMENT - 12/28/20 0001      General Information   Behavior/Cognition Alert;Cooperative    HPI Pt is a 23 y/.o. patient of Dr Manuella Ghazi who was referral for formal cognitive assessment and cognitive training d/t new diagnosis of dementia. 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 SLUMS done in office today was 10/30." MRI on 05/14/2020, which showed bilateral frontal and hippocampal atrophy, mild white matter microvascular ischemic and metabolic changes.       Treatment Provided   Treatment provided Cognitive-Linquistic      Pain Assessment   Pain Assessment No/denies pain      Cognitive-Linquistic Treatment   Treatment focused on Cognition;Patient/family/caregiver education  Skilled Treatment Independent with use of visual matching; however, required extended processing time. Maximal assistance faded to moderate cues required for completion of card matching game, "spot it".      Assessment / Recommendations / Plan   Plan Continue with current plan of care            SLP Education - 12/28/20 1248    Education Details provided    Person(s) Educated Patient;Spouse    Methods Explanation;Demonstration;Verbal cues;Tactile cues    Comprehension  Verbalized understanding;Returned demonstration;Need further instruction;Verbal cues required            SLP Short Term Goals - 10/28/20 2302      SLP SHORT TERM GOAL #4   Title With minimal cues, pt will complete semi-complex cognitive training tasks that target preservation and stmulation of cognitive functions.    Status Partially Met      SLP SHORT TERM GOAL #5   Title Pt will demonstrate increased understanding of dementia by answering basic questions with 90% accuracy.    Status Partially Met      SLP SHORT TERM GOAL #6   Title Pt will demonstrate recall of basic novel information with 80% accuracy and minimal cues.    Status Achieved            SLP Long Term Goals - 10/28/20 2303      SLP LONG TERM GOAL #1   Title Patient  and husband will identify cognitive-communication barriers and participate in developing functional compensatory strategies.    Status Partially Met      SLP LONG TERM GOAL #2   Title Patient will demonstrate functional cognitive-communication skills for independent completion of personal responsibilities and leisure activities.    Status Partially Met            Plan - 12/28/20 1253    Speech Therapy Frequency 2x / week    Duration 12 weeks    Treatment/Interventions Cognitive reorganization;SLP instruction and feedback;Internal/external aids;Cueing hierarchy;Functional tasks;Patient/family education    Potential to Achieve Goals Good    Potential Considerations Ability to learn/carryover information    SLP Home Exercise Plan provided    Consulted and Agree with Plan of Care Patient;Family member/caregiver    Family Member Consulted pt's husband           Patient will benefit from skilled therapeutic intervention in order to improve the following deficits and impairments:   Cognitive communication deficit    Problem List There are no problems to display for this patient.   Gabrielle Dare, Student SLP  Gabrielle Dare 12/28/2020, 12:55 PM  [place] MAIN [facility] SERVICES [address] [place], [place], [address] Phone: [phone]   Fax:  [phone]   Name: Barbara Wilkins MRN: [id number] Date of Birth: August 02, 1946

## 2020-12-28 NOTE — Patient Instructions (Signed)
Read book about the weather. After, list 3 types of weather, then fill in the blank regarding what you read.

## 2020-12-30 ENCOUNTER — Ambulatory Visit: Payer: Medicare Other | Admitting: Speech Pathology

## 2020-12-30 ENCOUNTER — Other Ambulatory Visit: Payer: Self-pay

## 2020-12-30 DIAGNOSIS — R41841 Cognitive communication deficit: Secondary | ICD-10-CM | POA: Diagnosis not present

## 2020-12-31 NOTE — Therapy (Signed)
Barbara Wilkins [facility] SERVICES [address] [place], [place], [address] Phone: [phone]   Fax:  [phone]  Speech Language Pathology Treatment  Patient Details  Name: Barbara Wilkins MRN: [id number] Date of Birth: Jun 23, 1946 Referring Provider (SLP): Vertell Novak Date: 12/30/2020   End of Session - 12/31/20 1207    Visit Number 39    Number of Visits 24    Date for SLP Re-Evaluation 01/04/21    Authorization Type [employer]    Authorization Time Period 10/12/2020 thru 01/04/2021    Authorization - Visit Number 9    Progress Note Due on Visit 10    SLP Start Time 1100    SLP Stop Time  1145    SLP Time Calculation (min) 45 min    Activity Tolerance Patient tolerated treatment well           No past medical history on file.  No past surgical history on file.  There were no vitals filed for this visit.   Subjective Assessment - 12/31/20 1204    Subjective pt states "not good" when asked how she is d/t continued migraines    Patient is accompained by: Family member          Neuro   ST   Barbara Wilkins and Patient's Response to Treatment   Skilled treatment session targeted pt's cognition goals. SLP facilitated session by providing maximal multimodal support to complete basic task related to new book that she is reading. Pt appeared in pain and had extended processing times today. Pt, her husband and I reviewed information (data) regarding pt's migrianes each day. It appears that her migraines decrease around early afternoon. Therefore recommend that we attempt some afternoon sessions and evaluate if pt is better able to participate. Pt also has an appt with her neurologist, Barbara Wilkins, [place] to discuss ways to get better migraine management.   Pt continues to depend on her spouse for all cognitive tasks specifically memory, problem solving. Skilled ST intervention continues to be required  to increase pt's functional independence and reduce caregiver burden.          SLP Education - 12/31/20 1205    Education Details Perhaps try an afternoon session when it appears headaches are less severe    Person(s) Educated Patient;Spouse    Methods Explanation;Demonstration;Verbal cues    Comprehension Verbalized understanding;Returned demonstration;Verbal cues required;Need further instruction            SLP Short Term Goals - 10/28/20 2302      SLP SHORT TERM GOAL #4   Title With minimal cues, pt will complete semi-complex cognitive training tasks that target preservation and stmulation of cognitive functions.    Status Partially Met      SLP SHORT TERM GOAL #5   Title Pt will demonstrate increased understanding of dementia by answering basic questions with 90% accuracy.    Status Partially Met      SLP SHORT TERM GOAL #6   Title Pt will demonstrate recall of basic novel information with 80% accuracy and minimal cues.    Status Achieved            SLP Long Term Goals - 10/28/20 2303      SLP LONG TERM GOAL #1   Title Patient  and husband will identify cognitive-communication barriers and participate in developing functional compensatory strategies.  Status Partially Met      SLP LONG TERM GOAL #2   Title Patient will demonstrate functional cognitive-communication skills for independent completion of personal responsibilities and leisure activities.    Status Partially Met            Plan - 12/31/20 1208    Speech Therapy Frequency 2x / week    Duration 12 weeks    Treatment/Interventions Cognitive reorganization;SLP instruction and feedback;Internal/external aids;Cueing hierarchy;Functional tasks;Patient/family education    Potential Considerations Medical prognosis;Pain level    SLP Home Exercise Plan provided    Consulted and Agree with Plan of Care Patient;Family member/caregiver    Family Member Consulted pt's husband           Patient will  benefit from skilled therapeutic intervention in order to improve the following deficits and impairments:   Cognitive communication deficit    Problem List There are no problems to display for this patient.  Barbara Wilkins M.S., CCC-SLP, [place] Pathologist Rehabilitation Services Office [phone]  Barbara Wilkins 12/31/2020, 12:10 PM  [place] [facility] SERVICES [address] [place], [place], [address] Phone: [phone]   Fax:  [phone]   Name: Barbara Wilkins MRN: [id number] Date of Birth: 1946/02/10

## 2020-12-31 NOTE — Patient Instructions (Signed)
Continue playing Spot-It with game at home

## 2021-01-03 ENCOUNTER — Ambulatory Visit: Payer: Medicare Other | Admitting: Speech Pathology

## 2021-01-03 ENCOUNTER — Other Ambulatory Visit: Payer: Self-pay

## 2021-01-03 DIAGNOSIS — R41841 Cognitive communication deficit: Secondary | ICD-10-CM | POA: Diagnosis not present

## 2021-01-04 ENCOUNTER — Ambulatory Visit: Payer: Medicare Other | Admitting: Speech Pathology

## 2021-01-04 NOTE — Patient Instructions (Signed)
Focus on cuing pt with semantic cues rather than the answer to promote increase cognitive processes during recall of events

## 2021-01-04 NOTE — Therapy (Signed)
[place] MAIN [facility] SERVICES [address] [place], [place], [address] Phone: [phone]   Fax:  [phone]  Speech Language Pathology Treatment  RE-CERTIFICATION  Speech Therapy 10TH VISIT Progress Note Dates of reporting period  10/28/2020 THRU 01/03/2021    Patient Details  Name: Barbara Wilkins MRN: [id number] Date of Birth: Mar 14, 1946 Referring Provider (SLP): Vertell Novak Date: 01/03/2021   End of Session - 01/04/21 0811    Visit Number 40    Number of Visits 70    Date for SLP Re-Evaluation 03/29/21    Authorization Type [employer] Medicare    Authorization Time Period 01/03/2021 thru 03/29/2021    Authorization - Visit Number 10    Progress Note Due on Visit 10    SLP Start Time 1300    SLP Stop Time  1400    SLP Time Calculation (min) 60 min    Activity Tolerance Patient tolerated treatment well           No past medical history on file.  No past surgical history on file.  There were no vitals filed for this visit.   Subjective Assessment - 01/04/21 0808    Subjective pt inconsistent in her responses appears to have decreased recall of information    Patient is accompained by: Family member    Currently in Pain? No/denies            Neuro   ST   TX   NOTE          Treatment Data and Patient's Response to Treatment  Skilled treatment session focused on pt's cognition goals, specifically instructing her husband on effectively cuing pt. Pt appears to be experiencing a decline in recall of information. As a result, pt's husband provides most answers for pt. Instruction and opportunities provided on effective ways to cue pt instead of providing the answer. Opportunities for practice were provided during game of UNO. [name] husband required max to moderate cues to utilize strategies. As such, skilled ST intervention continues to be indicated for further caregiver and pt training.     10TH VISIT PROGRESS  NOTE AND RE-CERTIFICATION REQUEST  Pt has experienced a decline in overall cognitive function. Suspect this decline might be related to increase in acute on chronic migraines as well as missing several sessions d/t possible COVID exposures. As such, she continues to be dependent on her husband for recall of all information as well as semi-complex cognitive tasks. Pt recently had an appt with her neurologist and her migraine medications were changed. Recommend continued ST services to specifically target caregiver training to facilitate cognitive stimulation throughout pt's day.         SLP Education - 01/04/21 [phone]    Education Details provided to pt and her husband on need to provide semantic cues/info to allow pt opportunity to use cognitive processes to aid with recall of information; simply providng the answer doesn't promote as much active cognition by pt    Person(s) Educated Patient;Spouse    Methods Explanation;Demonstration;Verbal cues    Comprehension Verbalized understanding;Returned demonstration;Verbal cues required;Need further instruction            SLP Short Term Goals - 01/04/21 0076      SLP SHORT TERM GOAL #4   Title With moderate cues, pt will complete semi-complex cognitive training tasks that target preservation and stmulation of cognitive functions.    Baseline moderate assistance with basic cognitive tasks    Time 10  Period --   sessions   Status Not Met      SLP SHORT TERM GOAL #5   Title Pt will demonstrate increased understanding of dementia by answering basic questions with 90% accuracy.    Baseline ~ 50% accurate response to questions    Time 10    Period --   sessions   Status Not Met      SLP SHORT TERM GOAL #6   Title Pt will demonstrate recall of basic novel information with 80% accuracy and moderate cues.    Baseline currently maximal cues form caregiver    Time 10    Status Not Met            SLP Long Term Goals - 01/04/21 0817       SLP LONG TERM GOAL #1   Title Patient  and husband will identify cognitive-communication barriers and participate in developing functional compensatory strategies.    Time 12    Period Weeks    Status On-going    Target Date 03/29/21      SLP LONG TERM GOAL #2   Title Patient will demonstrate functional cognitive-communication skills for independent completion of personal responsibilities and leisure activities.    Time 12    Period Weeks    Status On-going    Target Date 03/29/21            Plan - 01/04/21 0812    Speech Therapy Frequency 2x / week    Duration 12 weeks    Treatment/Interventions Cognitive reorganization;SLP instruction and feedback;Internal/external aids;Cueing hierarchy;Functional tasks;Patient/family education    Potential to Achieve Goals Fair    Potential Considerations Medical prognosis;Pain level    SLP Home Exercise Plan provided    Consulted and Agree with Plan of Care Patient;Family member/caregiver    Family Member Consulted pt's husband           Patient will benefit from skilled therapeutic intervention in order to improve the following deficits and impairments:   Cognitive communication deficit    Problem List There are no problems to display for this patient.  Brenlee Koskela B. Rutherford Nail M.S., CCC-SLP, [place] Pathologist Rehabilitation Services Office [phone]  [name] 01/04/2021, 4:31 PM  [place] MAIN [facility] SERVICES [address] [place], [place], [address] Phone: [phone]   Fax:  [phone]   Name: Barbara Wilkins MRN: [id number] Date of Birth: 16-Dec-1945

## 2021-01-05 ENCOUNTER — Ambulatory Visit: Payer: Medicare Other | Admitting: Speech Pathology

## 2021-01-05 ENCOUNTER — Other Ambulatory Visit: Payer: Self-pay

## 2021-01-05 DIAGNOSIS — R41841 Cognitive communication deficit: Secondary | ICD-10-CM | POA: Diagnosis not present

## 2021-01-06 ENCOUNTER — Encounter: Payer: Self-pay | Admitting: Speech Pathology

## 2021-01-06 NOTE — Patient Instructions (Signed)
Practice sorting items at home for increased independent ability to put up dishes etc

## 2021-01-06 NOTE — Therapy (Signed)
[place] MAIN [place] SERVICES [address] [place], [place], [address] Phone: [phone]   Fax:  [phone]  Speech Language Pathology Treatment  Patient Details  Name: Barbara Wilkins MRN: [id number] Date of Birth: 01-01-1946 Referring Provider (SLP): Vertell Novak Date: 01/05/2021   End of Session - 01/06/21 1629    Visit Number 41    Number of Visits 36    Date for SLP Re-Evaluation 03/29/21    Authorization Type [employer] Medicare    Authorization Time Period 01/03/2021 thru 03/29/2021    Authorization - Visit Number 1    Progress Note Due on Visit 10    SLP Start Time 1300    SLP Stop Time  1400    SLP Time Calculation (min) 60 min    Activity Tolerance Patient tolerated treatment well           No past medical history on file.  No past surgical history on file.  There were no vitals filed for this visit.   Subjective Assessment - 01/06/21 1627    Subjective pt inconsistent in her responses appears to have decreased recall of information    Patient is accompained by: Family member    Currently in Pain? No/denies            Neuro   ST   TX   NOTE          Treatment Data and Patient's Response to Treatment   Skilled treatment session focused on cognition and caregiver education. SLP facilitated session by suggesting that pt and husband invest in a different game of SPOT IT. Currently theirs has more abstract pictures and written information. Pt continues to struggle with identifying matching pictures with that particular version of SPOT IT. When using therapy set that contains more basic pictures, pt is independent. Pt and her husband have since ordered the animals version.   SLP further facilitated session by providing feedback and activity suggestions based on activities that they state are difficult, for example correctly sorting silverware. Multimodal cues were used to provide maximal support in helping  pt and her husband understand that the above task was a sorting task, not a sequencing task.   As such, skilled ST intervention is required to identify, explain and create strategies for barriers to pt's functional independence.          SLP Education - 01/06/21 1628    Education Details recent regression in cognitive function    Person(s) Educated Patient;Spouse    Methods Explanation;Demonstration;Verbal cues    Comprehension Verbalized understanding;Need further instruction            SLP Short Term Goals - 01/04/21 0812      SLP SHORT TERM GOAL #4   Title With moderate cues, pt will complete semi-complex cognitive training tasks that target preservation and stmulation of cognitive functions.    Baseline moderate assistance with basic cognitive tasks    Time 10    Period --   sessions   Status Not Met      SLP SHORT TERM GOAL #5   Title Pt will demonstrate increased understanding of dementia by answering basic questions with 90% accuracy.    Baseline ~ 50% accurate response to questions    Time 10    Period --   sessions   Status Not Met      SLP SHORT TERM GOAL #6   Title Pt will demonstrate recall of basic novel information  with 80% accuracy and moderate cues.    Baseline currently maximal cues form caregiver    Time 10    Status Not Met            SLP Long Term Goals - 01/04/21 0817      SLP LONG TERM GOAL #1   Title Patient  and husband will identify cognitive-communication barriers and participate in developing functional compensatory strategies.    Time 12    Period Weeks    Status On-going    Target Date 03/29/21      SLP LONG TERM GOAL #2   Title Patient will demonstrate functional cognitive-communication skills for independent completion of personal responsibilities and leisure activities.    Time 12    Period Weeks    Status On-going    Target Date 03/29/21            Plan - 01/06/21 1629    Speech Therapy Frequency 2x / week    Duration  12 weeks    Treatment/Interventions Cognitive reorganization;SLP instruction and feedback;Internal/external aids;Cueing hierarchy;Functional tasks;Patient/family education    Potential to Achieve Goals Fair    Potential Considerations Medical prognosis;Pain level    SLP Home Exercise Plan provided, see instruction section    Consulted and Agree with Plan of Care Patient;Family member/caregiver    Family Member Consulted pt's husband           Patient will benefit from skilled therapeutic intervention in order to improve the following deficits and impairments:   Cognitive communication deficit    Problem List There are no problems to display for this patient.  Ledarius Leeson B. Rutherford Nail M.S., CCC-SLP, [place] Pathologist Rehabilitation Services Office [phone]   [name] 01/06/2021, 4:31 PM  [place] MAIN [facility] SERVICES [address] [place], [place], [address] Phone: [phone]   Fax:  [phone]   Name: Barbara Wilkins MRN: [id number] Date of Birth: [birth year]

## 2021-01-11 ENCOUNTER — Ambulatory Visit: Payer: Medicare Other | Attending: Neurology | Admitting: Speech Pathology

## 2021-01-11 ENCOUNTER — Other Ambulatory Visit: Payer: Self-pay

## 2021-01-11 DIAGNOSIS — R41841 Cognitive communication deficit: Secondary | ICD-10-CM | POA: Insufficient documentation

## 2021-01-12 NOTE — Therapy (Signed)
[place] MAIN [facility] SERVICES [address] [place], [place], [address] Phone: [phone]   Fax:  [phone]  Speech Language Pathology Treatment  Patient Details  Name: Barbara Wilkins MRN: [id number] Date of Birth: February 25, 1946 Referring Provider (SLP): Vertell Novak Date: 01/11/2021   End of Session - 01/12/21 1624    Visit Number 42    Number of Visits 21    Date for SLP Re-Evaluation 03/29/21    Authorization Type [employer] Medicare    Authorization Time Period 01/03/2021 thru 03/29/2021    Authorization - Visit Number 2    Progress Note Due on Visit 10    SLP Start Time 1100    SLP Stop Time  1200    SLP Time Calculation (min) 60 min    Activity Tolerance Patient tolerated treatment well           No past medical history on file.  No past surgical history on file.  There were no vitals filed for this visit.   Subjective Assessment - 01/12/21 1620    Subjective pt reports exceptional migraine pain today, holding head in her hands    Patient is accompained by: Family member    Currently in Pain? Yes    Pain Score 10-Worst pain ever    Pain Location Head    Pain Descriptors / Indicators Aching    Pain Type Acute pain    Pain Onset --   growing acutely worse           Neuro   ST   TX   NOTE          Treatment Data and Patient's Response to Treatment   Skilled treatment session targeted pt's cognitive communication. Pt arrived to session with over a 10 in rating her migraine pain. Pt and husband report that they have attempted their new SPOT-IT game but pt was not able to participate d/t migraine pain. SLP facilitated session by presenting several activities that could to use cognitive processes but don't present a heavy cognitive load. SLP presented large scale coloring pages of a butterfly and spring setting. Pt was excited to "have something to do" instead of laying in bed d/t migraine pain. Husband reports  being in contact with pt's PCP and neurologist.         SLP Education - 01/12/21 1622    Education Details will attempt to re-schedule her ST appts for afternoon times in hopes that pt's pain will have subsided some as her day progresses    Person(s) Educated Patient;Spouse    Methods Explanation;Demonstration;Verbal cues    Comprehension Verbalized understanding            SLP Short Term Goals - 01/04/21 9371      SLP SHORT TERM GOAL #4   Title With moderate cues, pt will complete semi-complex cognitive training tasks that target preservation and stmulation of cognitive functions.    Baseline moderate assistance with basic cognitive tasks    Time 10    Period --   sessions   Status Not Met      SLP SHORT TERM GOAL #5   Title Pt will demonstrate increased understanding of dementia by answering basic questions with 90% accuracy.    Baseline ~ 50% accurate response to questions    Time 10    Period --   sessions   Status Not Met      SLP SHORT TERM GOAL #6   Title  Pt will demonstrate recall of basic novel information with 80% accuracy and moderate cues.    Baseline currently maximal cues form caregiver    Time 10    Status Not Met            SLP Long Term Goals - 01/04/21 0817      SLP LONG TERM GOAL #1   Title Patient  and husband will identify cognitive-communication barriers and participate in developing functional compensatory strategies.    Time 12    Period Weeks    Status On-going    Target Date 03/29/21      SLP LONG TERM GOAL #2   Title Patient will demonstrate functional cognitive-communication skills for independent completion of personal responsibilities and leisure activities.    Time 12    Period Weeks    Status On-going    Target Date 03/29/21            Plan - 01/12/21 1624    Speech Therapy Frequency 2x / week    Duration 12 weeks    Treatment/Interventions Cognitive reorganization;SLP instruction and feedback;Internal/external  aids;Cueing hierarchy;Functional tasks;Patient/family education    Potential to Achieve Goals Fair    Potential Considerations Medical prognosis;Pain level    SLP Home Exercise Plan provided, see instruction section    Consulted and Agree with Plan of Care Patient;Family member/caregiver    Family Member Consulted pt's husband           Patient will benefit from skilled therapeutic intervention in order to improve the following deficits and impairments:   Cognitive communication deficit    Problem List There are no problems to display for this patient.  Natasja Niday B. Rutherford Nail M.S., CCC-SLP, [place] Pathologist Rehabilitation Services Office [phone]  [name] 01/12/2021, 4:25 PM  [place] MAIN [facility] SERVICES [address] [place], [place], [address] Phone: [phone]   Fax:  [phone]   Name: Barbara Wilkins MRN: [id number] Date of Birth: 1946/05/05

## 2021-01-13 ENCOUNTER — Encounter: Payer: Self-pay | Admitting: Speech Pathology

## 2021-01-14 ENCOUNTER — Ambulatory Visit
Admission: RE | Admit: 2021-01-14 | Discharge: 2021-01-14 | Disposition: A | Discharge status: Home / Self Care | Payer: Medicare Other | Source: Ambulatory Visit | Attending: Internal Medicine | Admitting: Internal Medicine

## 2021-01-14 ENCOUNTER — Other Ambulatory Visit: Payer: Self-pay | Admitting: Internal Medicine

## 2021-01-14 ENCOUNTER — Other Ambulatory Visit: Payer: Self-pay

## 2021-01-14 DIAGNOSIS — G44201 Tension-type headache, unspecified, intractable: Secondary | ICD-10-CM

## 2021-01-14 IMAGING — CT CT HEAD W/O CM
4 series · 16 of 47 positions shown, 18 images · non-contrast
Comparison: MRI/MRA head [DATE].

CLINICAL DATA: Provided history: Acute intractable tension type
headache. Additional history provided by technologist: Patient
reports headache 4 weeks, migraines.

EXAM:
CT HEAD WITHOUT CONTRAST
TECHNIQUE: Contiguous axial images were obtained from the base of the skull
through the vertex without intravenous contrast.

[Series 2: head wo · axial · 0.40mm/px · z∈[+434,+534]mm · 7 of 28 slices shown, 9 images]
[im 4/28  brain]
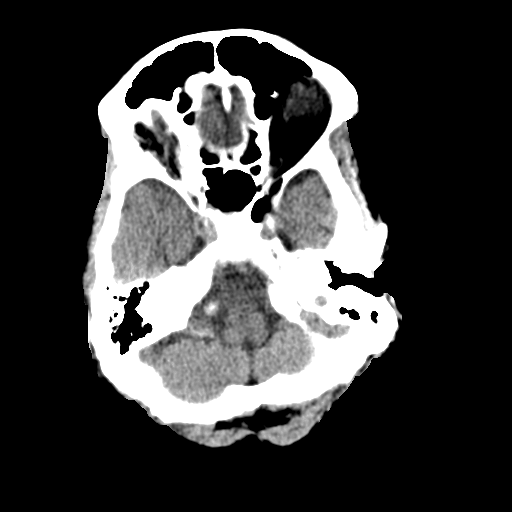
[im 4/28  bone]
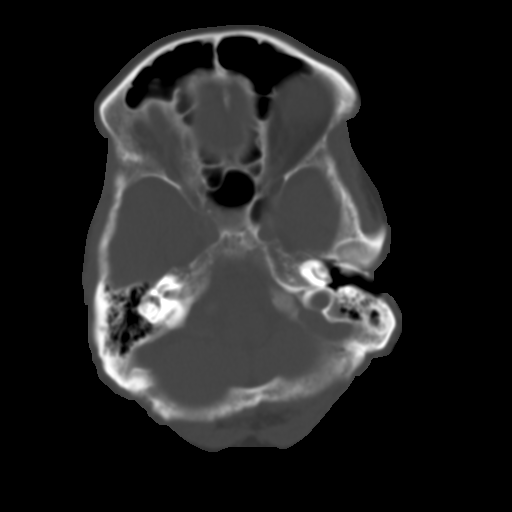
[im 7/28  brain]
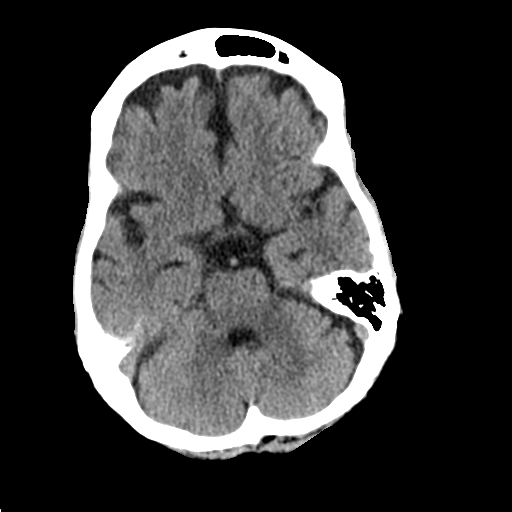
[im 11/28  brain]
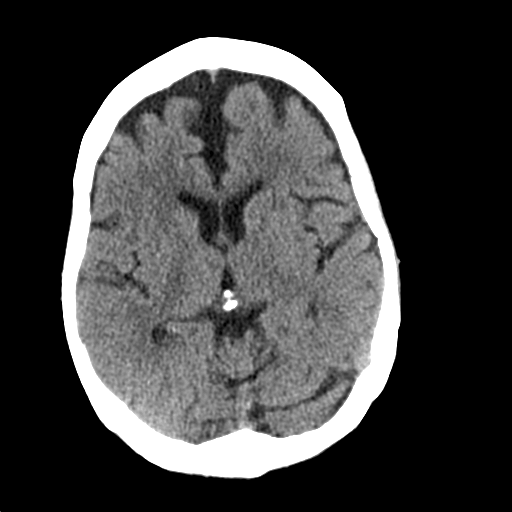
[im 14/28  brain]
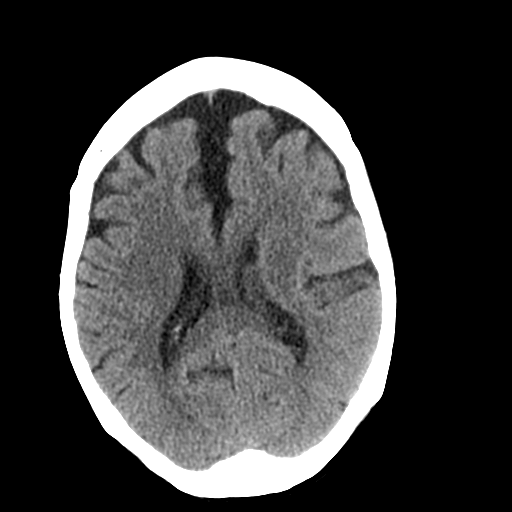
[im 17/28  brain]
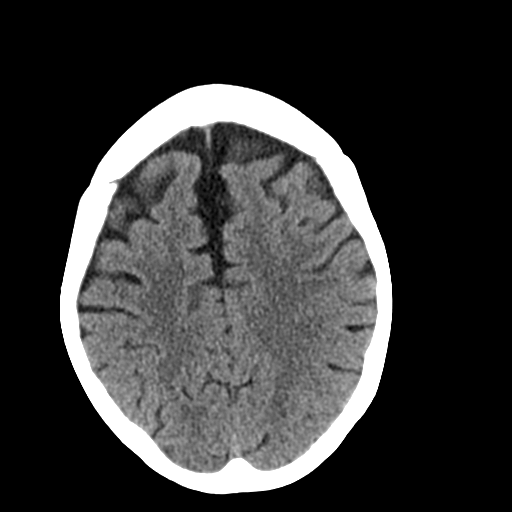
[im 17/28  bone]
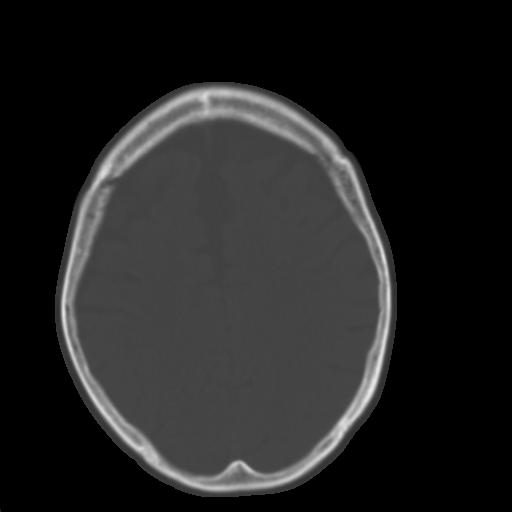
[im 21/28  brain]
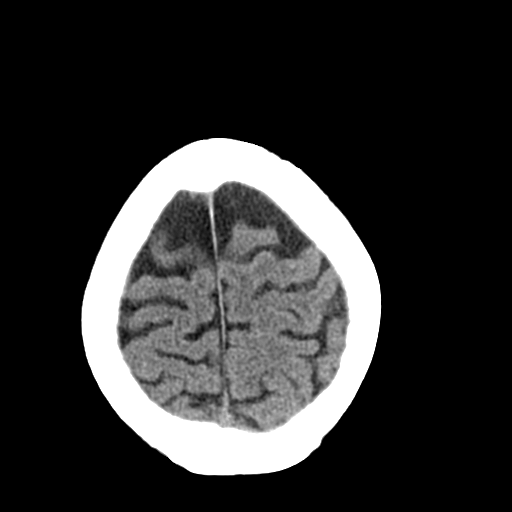
[im 24/28  brain]
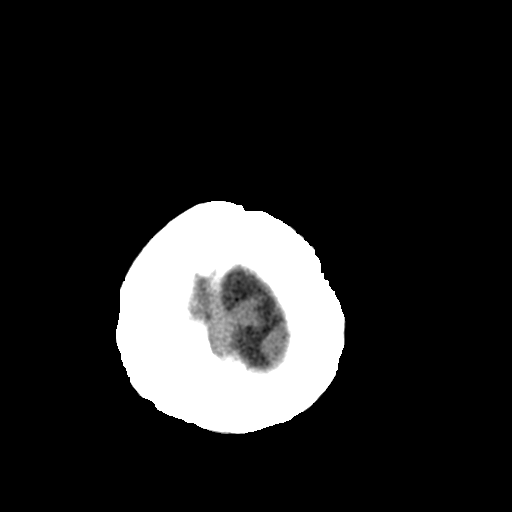

[Series 3: head bone · axial · 0.40mm/px · z∈[+431,+459]mm · 3 of 69 slices shown]
[im 7/69  bone]
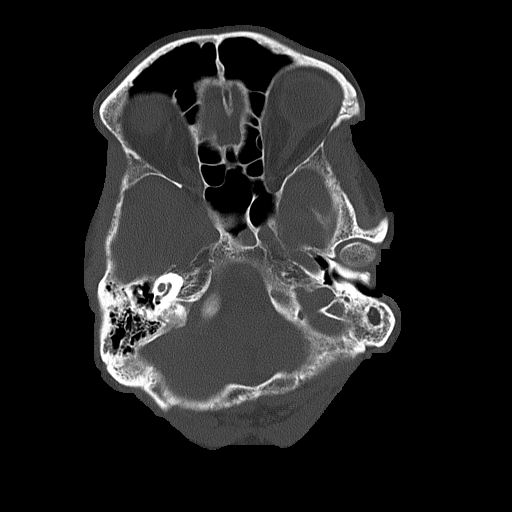
[im 14/69  bone]
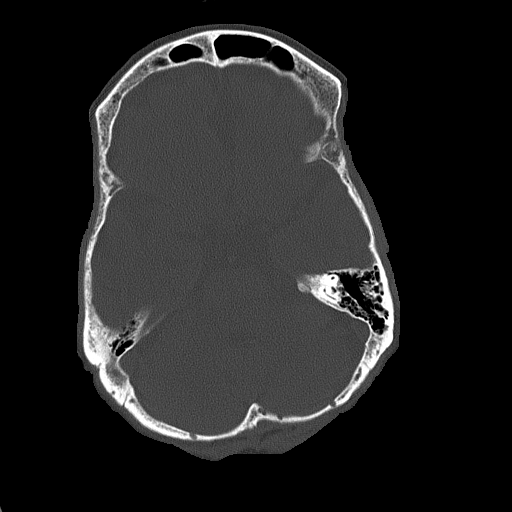
[im 21/69  bone]
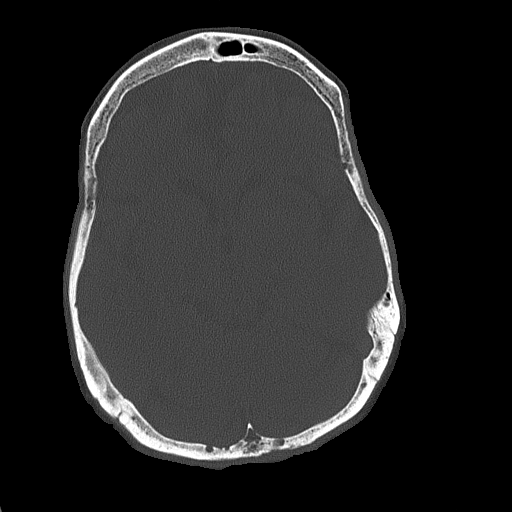

[Series 4: coronal soft tissue · coronal · 0.29mm/px · 3 of 63 slices shown]
[im 21/63  brain]
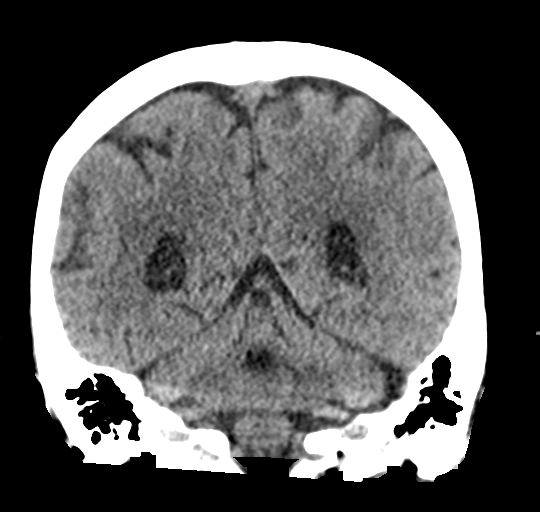
[im 28/63  brain]
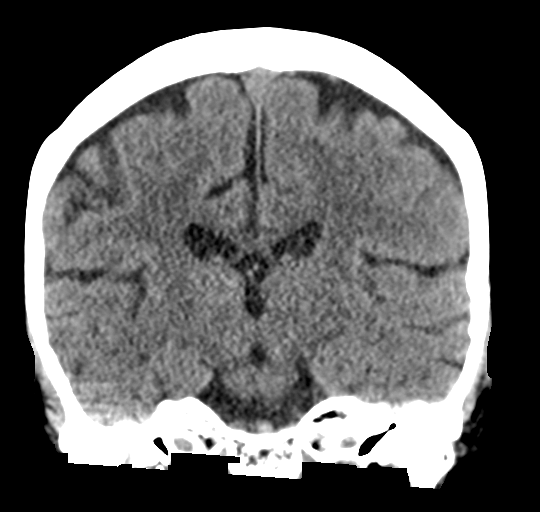
[im 35/63  brain]
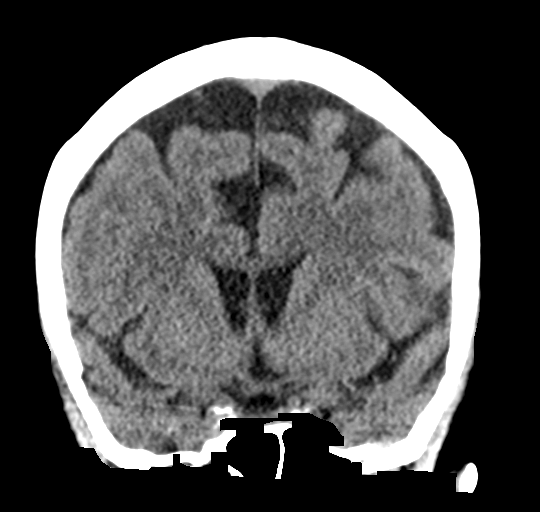

[Series 5: sagittal soft tissue · sagittal · 0.29mm/px · 3 of 52 slices shown]
[im 18/52  brain]
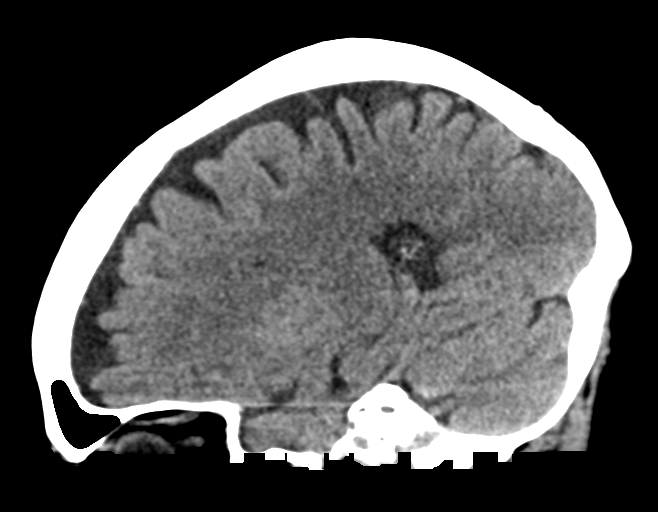
[im 26/52  brain]
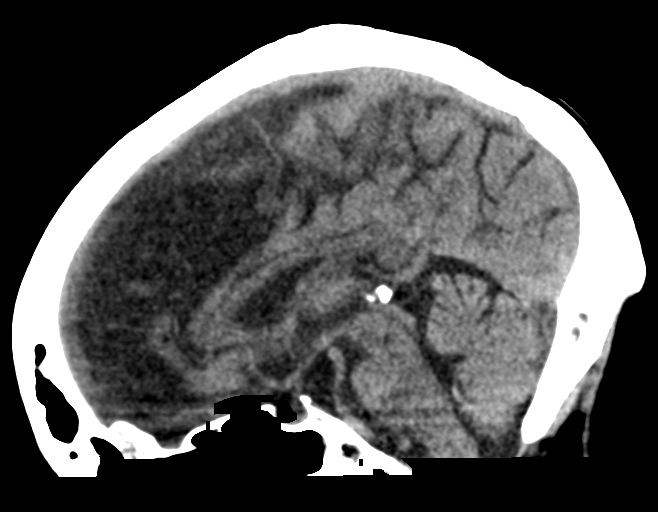
[im 35/52  brain]
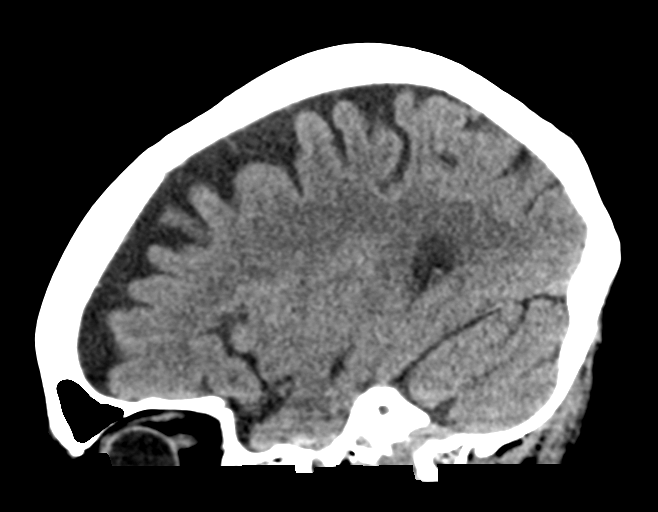

[16 of 47 positions shown; findings below may reference images not displayed]

FINDINGS: Brain:

Redemonstrated frontal lobe predominant cerebral atrophy.

Minimal patchy hypoattenuation within the cerebral white matter is
nonspecific, but compatible with chronic small vessel ischemic
disease.

There is no acute intracranial hemorrhage.

No demarcated cortical infarct.

No extra-axial fluid collection.

No evidence of intracranial mass.

No midline shift.

Vascular: No hyperdense vessel.  Atherosclerotic calcifications.

Skull: Normal. Negative for fracture or focal lesion.

Sinuses/Orbits: Visualized orbits show no acute finding. Small right
sphenoid sinus mucous retention cyst.

Other: Trace fluid within the bilateral mastoid air cells.
IMPRESSION: No evidence of acute intracranial abnormality.

Redemonstrated frontal lobe predominant cerebral atrophy.

Minimal cerebral white matter chronic small vessel ischemic disease.

Small right sphenoid sinus mucous retention cyst.

Trace fluid within the bilateral mastoid air cells.

## 2021-01-18 ENCOUNTER — Encounter: Payer: Self-pay | Admitting: Speech Pathology

## 2021-01-19 ENCOUNTER — Ambulatory Visit: Payer: Medicare Other | Admitting: Speech Pathology

## 2021-01-19 ENCOUNTER — Other Ambulatory Visit: Payer: Self-pay

## 2021-01-19 DIAGNOSIS — R41841 Cognitive communication deficit: Secondary | ICD-10-CM | POA: Diagnosis not present

## 2021-01-20 ENCOUNTER — Encounter: Payer: Self-pay | Admitting: Speech Pathology

## 2021-01-20 NOTE — Patient Instructions (Signed)
Recruit family members to help with accomplishing tasks around the house such as unpacking boxes

## 2021-01-20 NOTE — Therapy (Addendum)
[place] MAIN [facility] SERVICES [address] [place], [place], [address] Phone: [phone]   Fax:  [phone]  Speech Language Pathology Treatment  Patient Details  Name: Barbara Wilkins MRN: [id number] Date of Birth: April 24, 1946 Referring Provider (SLP): Vertell Novak Date: 01/19/2021   End of Session - 01/20/21 1715    Visit Number 43    Number of Visits 81    Date for SLP Re-Evaluation 03/29/21    Authorization Type [employer]    Authorization Time Period 01/03/2021 thru 03/29/2021    Authorization - Visit Number 3    Progress Note Due on Visit 10    SLP Start Time 1400    SLP Stop Time  1500    SLP Time Calculation (min) 60 min    Activity Tolerance Patient tolerated treatment well           No past medical history on file.  No past surgical history on file.  There were no vitals filed for this visit.   Subjective Assessment - 01/20/21 1714    Subjective pt reports that she doesn't have a headache today    Patient is accompained by: Family member    Currently in Pain? No/denies            Neuro   ST   TX   NOTE          Treatment Data and Patient's Response to Treatment   Skilled treatment session focused on pt's cognitive communication goals. SLP facilitiated session by providing a review of various activities and concepts taught within skilled ST intervention. At this time, pt and her husband feel that activities are going well and pt has been involved in more activities around the house as pt's husband now understands how to cue pt for increased involvement. Pt stated that it is difficulty for her to unpack their moving boxes as she is still "not over having to move." At this time, it has been a year since they have moved. Education provided to seek family support to help with unpacking the boxes. Pt also stated that she doesn't know her neighbors even after a year. During the next session, we will  target conversation starters as well as practice recall of neighbors' names.         SLP Education - 01/20/21 1715    Education Details need to have family members help with some tasks    Person(s) Educated Patient;Spouse    Methods Explanation;Demonstration;Verbal cues    Comprehension Verbalized understanding            SLP Short Term Goals - 01/04/21 0812      SLP SHORT TERM GOAL #4   Title With moderate cues, pt will complete semi-complex cognitive training tasks that target preservation and stmulation of cognitive functions.    Baseline moderate assistance with basic cognitive tasks    Time 10    Period --   sessions   Status Not Met      SLP SHORT TERM GOAL #5   Title Pt will demonstrate increased understanding of dementia by answering basic questions with 90% accuracy.    Baseline ~ 50% accurate response to questions    Time 10    Period --   sessions   Status Not Met      SLP SHORT TERM GOAL #6   Title Pt will demonstrate recall of basic novel information with 80% accuracy and moderate cues.  Baseline currently maximal cues form caregiver    Time 10    Status Not Met            SLP Long Term Goals - 01/04/21 0817      SLP LONG TERM GOAL #1   Title Patient  and husband will identify cognitive-communication barriers and participate in developing functional compensatory strategies.    Time 12    Period Weeks    Status On-going    Target Date 03/29/21      SLP LONG TERM GOAL #2   Title Patient will demonstrate functional cognitive-communication skills for independent completion of personal responsibilities and leisure activities.    Time 12    Period Weeks    Status On-going    Target Date 03/29/21            Plan - 01/20/21 1728    Speech Therapy Frequency 2x / week    Duration 12 weeks    Treatment/Interventions Cognitive reorganization;SLP instruction and feedback;Internal/external aids;Cueing hierarchy;Functional tasks;Patient/family  education    Potential to [place] provided, see instruction section    Consulted and Agree with Plan of Care Patient;Family member/caregiver    Family Member Consulted pt's husband           Patient will benefit from skilled therapeutic intervention in order to improve the following deficits and impairments:   Cognitive communication deficit    Problem List There are no problems to display for this patient.  Jorje Vanatta B. Rutherford Nail M.S., CCC-SLP, [place] Pathologist Rehabilitation Services Office [phone]  [name] 01/20/2021, 5:30 PM  [place] MAIN [facility] SERVICES [address] [place], [place], [address] Phone: [phone]   Fax:  [phone]   Name: Barbara Wilkins MRN: [id number] Date of Birth: Jun 16, 1946

## 2021-01-24 ENCOUNTER — Other Ambulatory Visit: Payer: Self-pay

## 2021-01-24 ENCOUNTER — Ambulatory Visit: Payer: Medicare Other | Admitting: Speech Pathology

## 2021-01-24 DIAGNOSIS — R41841 Cognitive communication deficit: Secondary | ICD-10-CM | POA: Diagnosis not present

## 2021-01-25 ENCOUNTER — Encounter: Payer: Self-pay | Admitting: Speech Pathology

## 2021-01-25 NOTE — Therapy (Signed)
[place] MAIN [facility] SERVICES [address] [place], [place], [address] Phone: [phone]   Fax:  [phone]  Speech Language Pathology Treatment DISCHARGE SUMMARY  Patient Details  Name: Barbara Wilkins MRN: [id number] Date of Birth: 1946/05/22 Referring Provider (SLP): Vertell Novak Date: 01/24/2021   End of Session - 01/25/21 1955    Visit Number 44    Number of Visits [place] Medicare    Authorization - Visit Number 4    Progress Note Due on Visit 10    SLP Start Time 1400    SLP Stop Time  1500    SLP Time Calculation (min) 60 min    Activity Tolerance Patient tolerated treatment well           No past medical history on file.  No past surgical history on file.  There were no vitals filed for this visit.   Subjective Assessment - 01/25/21 1953    Subjective pt states that she hasn't had a headache since starting new migraine medicine    Patient is accompained by: Family member    Currently in Pain? No/denies            Neuro   ST   TX   NOTE          Treatment Data and Patient's Response to Treatment   Skilled treatment session focused on completing pt's cognitive training. Pt arrived to session with her husband. She appeared in good spirits and continues to be migraine free. SLP facilitated session by following up on previously introduced strategies. Pt was not comfortable recruiting help with unpacking any boxes. She continues to state that their current house doesn't feel like home, this is likely d/t living out of boxes 1 year post moving. Education provided. No additional cognitive communication barriers are identified by pt and her husband.   Pt has made great progress over the course of skilled ST. As a result, pt and her husband have been able to return demonstrate ways to promote further cognitive stimulation within daily living. Activities included introducing pt to  puzzles, checkers, reading, word searches, and how to sort items such as dishes/silverware when putting dishes away. Extensive education has been provided to pt's husband on ways to provide stimulable cues instead of providing answers. At this time, pt has met all of her STG and LTGs and is appropriate for discharge from skilled ST intervention.          SLP Education - 01/25/21 1955    Education Details completed    Person(s) Educated Patient;Spouse    Methods Explanation;Demonstration;Verbal cues;Handout    Comprehension Verbalized understanding;Returned demonstration            SLP Short Term Goals - 01/25/21 1958      SLP SHORT TERM GOAL #4   Title With moderate cues, pt will complete semi-complex cognitive training tasks that target preservation and stmulation of cognitive functions.    Status Achieved      SLP SHORT TERM GOAL #5   Title Pt will demonstrate increased understanding of dementia by answering basic questions with 90% accuracy.    Status Achieved      SLP SHORT TERM GOAL #6   Title Pt will demonstrate recall of basic novel information with 80% accuracy and moderate cues.    Status Achieved            SLP Long Term Goals - 01/25/21 6063  SLP LONG TERM GOAL #1   Title Patient  and husband will identify cognitive-communication barriers and participate in developing functional compensatory strategies.    Status Achieved      SLP LONG TERM GOAL #2   Title Patient will demonstrate functional cognitive-communication skills for independent completion of personal responsibilities and leisure activities.    Status Achieved            Plan - 01/25/21 1958    SLP Home Exercise Plan provided, see instruction section    Consulted and Agree with Plan of Care Patient;Family member/caregiver    Family Member Consulted pt's husband          Problem List There are no problems to display for this patient.  Happi B. Rutherford Nail M.S., CCC-SLP,  [place] Office [phone]  [name] 01/25/2021, 8:00 PM  [place] MAIN [facility] SERVICES [address] [place], [place], [address] Phone: [phone]   Fax:  [phone]   Name: TABBY BEASTON MRN: [id number] Date of Birth: 1946-09-09

## 2021-01-25 NOTE — Patient Instructions (Signed)
Continue engaging in cognitive tasks and utilizing memory strategies.

## 2021-01-26 ENCOUNTER — Ambulatory Visit: Payer: Medicare Other | Admitting: Speech Pathology

## 2021-01-27 ENCOUNTER — Encounter: Payer: Self-pay | Admitting: Speech Pathology

## 2021-01-27 ENCOUNTER — Ambulatory Visit: Payer: Medicare Other | Admitting: Gastroenterology

## 2021-01-31 ENCOUNTER — Encounter: Payer: Self-pay | Admitting: Speech Pathology

## 2021-02-01 ENCOUNTER — Encounter: Payer: Self-pay | Admitting: Speech Pathology

## 2021-02-02 ENCOUNTER — Encounter: Payer: Self-pay | Admitting: Speech Pathology

## 2021-02-03 ENCOUNTER — Encounter: Payer: Self-pay | Admitting: Speech Pathology

## 2021-02-07 ENCOUNTER — Encounter: Payer: Self-pay | Admitting: Speech Pathology

## 2021-02-08 ENCOUNTER — Encounter: Payer: Self-pay | Admitting: Speech Pathology

## 2021-02-09 ENCOUNTER — Encounter: Payer: Self-pay | Admitting: Speech Pathology

## 2021-02-10 ENCOUNTER — Encounter: Payer: Self-pay | Admitting: Speech Pathology

## 2021-02-14 ENCOUNTER — Encounter: Payer: Self-pay | Admitting: Speech Pathology

## 2021-02-16 ENCOUNTER — Encounter: Payer: Self-pay | Admitting: Speech Pathology

## 2021-02-21 ENCOUNTER — Encounter: Payer: Self-pay | Admitting: Speech Pathology

## 2021-02-23 ENCOUNTER — Encounter: Payer: Self-pay | Admitting: Speech Pathology

## 2021-02-28 ENCOUNTER — Encounter: Payer: Self-pay | Admitting: Speech Pathology

## 2021-03-02 ENCOUNTER — Encounter: Payer: Self-pay | Admitting: Speech Pathology

## 2021-03-07 ENCOUNTER — Encounter: Payer: Self-pay | Admitting: Speech Pathology

## 2021-03-09 ENCOUNTER — Encounter: Payer: Self-pay | Admitting: Speech Pathology

## 2021-12-29 ENCOUNTER — Telehealth: Payer: Self-pay

## 2021-12-29 NOTE — Telephone Encounter (Signed)
Barbara Wilkins from [facility] left msg on triage saying Premarin cream is not covered by insurance. Estradiol is and if new Rx could be sent in. PT IS NOT UNDER OUR CARE.

## 2022-04-28 ENCOUNTER — Other Ambulatory Visit: Payer: Self-pay | Admitting: Internal Medicine

## 2022-04-28 DIAGNOSIS — Z1231 Encounter for screening mammogram for malignant neoplasm of breast: Secondary | ICD-10-CM

## 2022-07-13 ENCOUNTER — Other Ambulatory Visit: Payer: Self-pay | Admitting: Student

## 2022-07-13 DIAGNOSIS — R4189 Other symptoms and signs involving cognitive functions and awareness: Secondary | ICD-10-CM

## 2022-08-01 ENCOUNTER — Ambulatory Visit
Admission: RE | Admit: 2022-08-01 | Discharge: 2022-08-01 | Disposition: A | Discharge status: Home / Self Care | Payer: Medicare Other | Source: Ambulatory Visit | Attending: Student | Admitting: Student

## 2022-08-01 DIAGNOSIS — R4189 Other symptoms and signs involving cognitive functions and awareness: Secondary | ICD-10-CM

## 2022-08-07 ENCOUNTER — Ambulatory Visit: Payer: Medicare Other | Admitting: Dermatology

## 2022-08-07 DIAGNOSIS — L821 Other seborrheic keratosis: Secondary | ICD-10-CM | POA: Diagnosis not present

## 2022-08-07 DIAGNOSIS — L578 Other skin changes due to chronic exposure to nonionizing radiation: Secondary | ICD-10-CM

## 2022-08-07 DIAGNOSIS — L570 Actinic keratosis: Secondary | ICD-10-CM

## 2022-08-07 NOTE — Patient Instructions (Addendum)
Seborrheic Keratosis  What causes seborrheic keratoses? Seborrheic keratoses are harmless, common skin growths that first appear during adult life.  As time goes by, more growths appear.  Some people may develop a large number of them.  Seborrheic keratoses appear on both covered and uncovered body parts.  They are not caused by sunlight.  The tendency to develop seborrheic keratoses can be inherited.  They vary in color from skin-colored to gray, brown, or even black.  They can be either smooth or have a rough, warty surface.   Seborrheic keratoses are superficial and look as if they were stuck on the skin.  Under the microscope this type of keratosis looks like layers upon layers of skin.  That is why at times the top layer may seem to fall off, but the rest of the growth remains and re-grows.    Treatment Seborrheic keratoses do not need to be treated, but can easily be removed in the office.  Seborrheic keratoses often cause symptoms when they rub on clothing or jewelry.  Lesions can be in the way of shaving.  If they become inflamed, they can cause itching, soreness, or burning.  Removal of a seborrheic keratosis can be accomplished by freezing, burning, or surgery. If any spot bleeds, scabs, or grows rapidly, please return to have it checked, as these can be an indication of a skin cancer.     Actinic keratoses are precancerous spots that appear secondary to cumulative UV radiation exposure/sun exposure over time. They are chronic with expected duration over 1 year. A portion of actinic keratoses will progress to squamous cell carcinoma of the skin. It is not possible to reliably predict which spots will progress to skin cancer and so treatment is recommended to prevent development of skin cancer.  Recommend daily broad spectrum sunscreen SPF 30+ to sun-exposed areas, reapply every 2 hours as needed.  Recommend staying in the shade or wearing long sleeves, sun glasses (UVA+UVB protection) and  wide brim hats (4-inch brim around the entire circumference of the hat). Call for new or changing lesions.   Cryotherapy Aftercare  Wash gently with soap and water everyday.   Apply Vaseline and Band-Aid daily until healed.        Due to recent changes in healthcare laws, you may see results of your pathology and/or laboratory studies on MyChart before the doctors have had a chance to review them. We understand that in some cases there may be results that are confusing or concerning to you. Please understand that not all results are received at the same time and often the doctors may need to interpret multiple results in order to provide you with the best plan of care or course of treatment. Therefore, we ask that you please give us 2 business days to thoroughly review all your results before contacting the office for clarification. Should we see a critical lab result, you will be contacted sooner.   If You Need Anything After Your Visit  If you have any questions or concerns for your doctor, please call our main line at [phone] and press option 4 to reach your doctor's medical assistant. If no one answers, please leave a voicemail as directed and we will return your call as soon as possible. Messages left after 4 pm will be answered the following business day.   You may also send us a message via MyChart. We typically respond to MyChart messages within 1-2 business days.  For prescription refills, please ask your pharmacy   to contact our office. Our fax number is [phone].  If you have an urgent issue when the clinic is closed that cannot wait until the next business day, you can page your doctor at the number below.    Please note that while we do our best to be available for urgent issues outside of office hours, we are not available 24/7.   If you have an urgent issue and are unable to reach us, you may choose to seek medical care at your doctor's office, retail clinic, urgent  care center, or emergency room.  If you have a medical emergency, please immediately call 911 or go to the emergency department.  Pager Numbers  - Dr. Kowalski: [phone]  - Dr. Moye: [phone]  - Dr. Stewart: [phone]  In the event of inclement weather, please call our main line at [phone] for an update on the status of any delays or closures.  Dermatology Medication Tips: Please keep the boxes that topical medications come in in order to help keep track of the instructions about where and how to use these. Pharmacies typically print the medication instructions only on the boxes and not directly on the medication tubes.   If your medication is too expensive, please contact our office at [phone] option 4 or send us a message through MyChart.   We are unable to tell what your co-pay for medications will be in advance as this is different depending on your insurance coverage. However, we may be able to find a substitute medication at lower cost or fill out paperwork to get insurance to cover a needed medication.   If a prior authorization is required to get your medication covered by your insurance company, please allow us 1-2 business days to complete this process.  Drug prices often vary depending on where the prescription is filled and some pharmacies may offer cheaper prices.  The website www.goodrx.com contains coupons for medications through different pharmacies. The prices here do not account for what the cost may be with help from insurance (it may be cheaper with your insurance), but the website can give you the price if you did not use any insurance.  - You can print the associated coupon and take it with your prescription to the pharmacy.  - You may also stop by our office during regular business hours and pick up a GoodRx coupon card.  - If you need your prescription sent electronically to a different pharmacy, notify our office through [facility] MyChart or  by phone at [phone] option 4.     Si Usted Necesita Algo Despus de Su Visita  Tambin puede enviarnos un mensaje a travs de MyChart. Por lo general respondemos a los mensajes de MyChart en el transcurso de 1 a 2 das hbiles.  Para renovar recetas, por favor pida a su farmacia que se ponga en contacto con nuestra oficina. Nuestro nmero de fax es el [phone].  Si tiene un asunto urgente cuando la clnica est cerrada y que no puede esperar hasta el siguiente da hbil, puede llamar/localizar a su doctor(a) al nmero que aparece a continuacin.   Por favor, tenga en cuenta que aunque hacemos todo lo posible para estar disponibles para asuntos urgentes fuera del horario de oficina, no estamos disponibles las 24 horas del da, los 7 das de la semana.   Si tiene un problema urgente y no puede comunicarse con nosotros, puede optar por buscar atencin mdica  en el consultorio de su doctor(a), en una   clnica privada, en un centro de atencin urgente o en una sala de emergencias.  Si tiene una emergencia mdica, por favor llame inmediatamente al 911 o vaya a la sala de emergencias.  Nmeros de bper  - Dr. Kowalski: [phone]  - Dra. Moye: [phone]  - Dra. Stewart: [phone]  En caso de inclemencias del tiempo, por favor llame a nuestra lnea principal al [phone] para una actualizacin sobre el estado de cualquier retraso o cierre.  Consejos para la medicacin en dermatologa: Por favor, guarde las cajas en las que vienen los medicamentos de uso tpico para ayudarle a seguir las instrucciones sobre dnde y cmo usarlos. Las farmacias generalmente imprimen las instrucciones del medicamento slo en las cajas y no directamente en los tubos del medicamento.   Si su medicamento es muy caro, por favor, pngase en contacto con nuestra oficina llamando al [phone] y presione la opcin 4 o envenos un mensaje a travs de MyChart.   No podemos decirle cul ser su  copago por los medicamentos por adelantado ya que esto es diferente dependiendo de la cobertura de su seguro. Sin embargo, es posible que podamos encontrar un medicamento sustituto a menor costo o llenar un formulario para que el seguro cubra el medicamento que se considera necesario.   Si se requiere una autorizacin previa para que su compaa de seguros cubra su medicamento, por favor permtanos de 1 a 2 das hbiles para completar este proceso.  Los precios de los medicamentos varan con frecuencia dependiendo del lugar de dnde se surte la receta y alguna farmacias pueden ofrecer precios ms baratos.  El sitio web www.goodrx.com tiene cupones para medicamentos de diferentes farmacias. Los precios aqu no tienen en cuenta lo que podra costar con la ayuda del seguro (puede ser ms barato con su seguro), pero el sitio web puede darle el precio si no utiliz ningn seguro.  - Puede imprimir el cupn correspondiente y llevarlo con su receta a la farmacia.  - Tambin puede pasar por nuestra oficina durante el horario de atencin regular y recoger una tarjeta de cupones de GoodRx.  - Si necesita que su receta se enve electrnicamente a una farmacia diferente, informe a nuestra oficina a travs de MyChart de [place] o por telfono llamando al [phone] y presione la opcin 4.  

## 2022-08-07 NOTE — Progress Notes (Unsigned)
   New Patient Visit  Subjective  Barbara Wilkins is a 76 y.o. female who presents for the following: New Patient (Initial Visit) (Patient here today with son. Son reports patient has growth at back has been there for many years doesn't bother patient. Also would like some spots at face checked. No history of skin cancer. ). The patient has spots, moles and lesions to be evaluated, some may be new or changing and the patient has concerns that these could be cancer.  The following portions of the chart were reviewed this encounter and updated as appropriate:   Tobacco  Allergies  Meds  Problems  Med Hx  Surg Hx  Fam Hx     Review of Systems:  No other skin or systemic complaints except as noted in HPI or Assessment and Plan.  Objective  Well appearing patient in no apparent distress; mood and affect are within normal limits.  A focused examination was performed including face, back . Relevant physical exam findings are noted in the Assessment and Plan.  left cheek x 2 , right cheek x 1 (3) Erythematous thin papules/macules with gritty scale.    Assessment & Plan  Actinic keratosis (3) left cheek x 2 , right cheek x 1  Actinic keratoses are precancerous spots that appear secondary to cumulative UV radiation exposure/sun exposure over time. They are chronic with expected duration over 1 year. A portion of actinic keratoses will progress to squamous cell carcinoma of the skin. It is not possible to reliably predict which spots will progress to skin cancer and so treatment is recommended to prevent development of skin cancer.  Recommend daily broad spectrum sunscreen SPF 30+ to sun-exposed areas, reapply every 2 hours as needed.  Recommend staying in the shade or wearing long sleeves, sun glasses (UVA+UVB protection) and wide brim hats (4-inch brim around the entire circumference of the hat). Call for new or changing lesions.  Destruction of lesion - left cheek x 2 , right cheek x  1 Complexity: simple   Destruction method: cryotherapy   Informed consent: discussed and consent obtained   Timeout:  patient name, date of birth, surgical site, and procedure verified Lesion destroyed using liquid nitrogen: Yes   Region frozen until ice ball extended beyond lesion: Yes   Outcome: patient tolerated procedure well with no complications   Post-procedure details: wound care instructions given   Additional details:  Prior to procedure, discussed risks of blister formation, small wound, skin dyspigmentation, or rare scar following cryotherapy. Recommend Vaseline ointment to treated areas while healing.   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 - Observe - Call for any changes  Actinic Damage - chronic, secondary to cumulative UV radiation exposure/sun exposure over time - diffuse scaly erythematous macules with underlying dyspigmentation - Recommend daily broad spectrum sunscreen SPF 30+ to sun-exposed areas, reapply every 2 hours as needed.  - Recommend staying in the shade or wearing long sleeves, sun glasses (UVA+UVB protection) and wide brim hats (4-inch brim around the entire circumference of the hat). - Call for new or changing lesions.  Return in about 1 year (around 08/08/2023) for ak and isk follow up.  IRuthell Rummage, CMA, am acting as scribe for Sarina Ser, MD. Documentation: I have reviewed the above documentation for accuracy and completeness, and I agree with the above.  Sarina Ser, MD

## 2022-08-08 ENCOUNTER — Other Ambulatory Visit (HOSPITAL_COMMUNITY): Payer: Self-pay | Admitting: Internal Medicine

## 2022-08-08 ENCOUNTER — Other Ambulatory Visit: Payer: Self-pay | Admitting: Internal Medicine

## 2022-08-08 DIAGNOSIS — R1031 Right lower quadrant pain: Secondary | ICD-10-CM

## 2022-08-09 ENCOUNTER — Encounter: Payer: Self-pay | Admitting: Dermatology

## 2022-08-21 ENCOUNTER — Ambulatory Visit
Admission: RE | Admit: 2022-08-21 | Discharge: 2022-08-21 | Disposition: A | Discharge status: Home / Self Care | Payer: Medicare Other | Source: Ambulatory Visit | Attending: Internal Medicine | Admitting: Internal Medicine

## 2022-08-21 DIAGNOSIS — R1031 Right lower quadrant pain: Secondary | ICD-10-CM | POA: Diagnosis present

## 2022-08-21 DIAGNOSIS — R1032 Left lower quadrant pain: Secondary | ICD-10-CM | POA: Insufficient documentation

## 2022-08-21 LAB — POCT I-STAT CREATININE: Creatinine, Ser: 0.9 mg/dL (ref 0.44–1.00)

## 2022-08-21 MED ORDER — IOHEXOL 300 MG/ML  SOLN: 100.0000 mL | Freq: Once | INTRAMUSCULAR | Status: DC | PRN

## 2022-08-21 MED ORDER — IOHEXOL 300 MG/ML  SOLN
80.0000 mL | Freq: Once | INTRAMUSCULAR | Status: AC | PRN
  Administered 2022-08-21: 80 mL via INTRAVENOUS

## 2022-11-22 ENCOUNTER — Emergency Department: Payer: Medicare HMO

## 2022-11-22 ENCOUNTER — Other Ambulatory Visit: Payer: Self-pay

## 2022-11-22 ENCOUNTER — Emergency Department
Admission: EM | Admit: 2022-11-22 | Discharge: 2022-11-22 | Disposition: A | Discharge status: Home / Self Care | Payer: Medicare HMO | Attending: Student in an Organized Health Care Education/Training Program | Admitting: Student in an Organized Health Care Education/Training Program

## 2022-11-22 DIAGNOSIS — F0390 Unspecified dementia without behavioral disturbance: Secondary | ICD-10-CM | POA: Diagnosis not present

## 2022-11-22 DIAGNOSIS — E860 Dehydration: Secondary | ICD-10-CM | POA: Diagnosis not present

## 2022-11-22 DIAGNOSIS — R42 Dizziness and giddiness: Secondary | ICD-10-CM | POA: Diagnosis present

## 2022-11-22 LAB — CBC
HCT: 41.3 % (ref 36.0–46.0)
Hemoglobin: 13.2 g/dL (ref 12.0–15.0)
MCH: 29.2 pg (ref 26.0–34.0)
MCHC: 32 g/dL (ref 30.0–36.0)
MCV: 91.4 fL (ref 80.0–100.0)
Platelets: 203 10*3/uL (ref 150–400)
RBC: 4.52 MIL/uL (ref 3.87–5.11)
RDW: 13.2 % (ref 11.5–15.5)
WBC: 9.5 10*3/uL (ref 4.0–10.5)
nRBC: 0 % (ref 0.0–0.2)

## 2022-11-22 LAB — BASIC METABOLIC PANEL
Anion gap: 12 (ref 5–15)
BUN: 19 mg/dL (ref 8–23)
CO2: 21 mmol/L — ABNORMAL LOW (ref 22–32)
Calcium: 9.4 mg/dL (ref 8.9–10.3)
Chloride: 105 mmol/L (ref 98–111)
Creatinine, Ser: 0.97 mg/dL (ref 0.44–1.00)
GFR, Estimated: >60 mL/min (ref >60)
Glucose, Bld: 120 mg/dL — ABNORMAL HIGH (ref 70–99)
Potassium: 3.6 mmol/L (ref 3.5–5.1)
Sodium: 138 mmol/L (ref 135–145)

## 2022-11-22 LAB — TROPONIN I (HIGH SENSITIVITY): Troponin I (High Sensitivity): 5 ng/L (ref <18)

## 2022-11-22 MED ORDER — SODIUM CHLORIDE 0.9 % IV BOLUS
1000.0000 mL | Freq: Once | INTRAVENOUS | Status: AC
  Administered 2022-11-22: 1000 mL via INTRAVENOUS

## 2022-11-22 NOTE — ED Provider Notes (Signed)
[facility] Provider Note    Event Date/Time   First MD Initiated Contact with Patient 11/22/22 1259     (approximate)   History   Hypotension   HPI  Barbara Wilkins is a 77 y.o. female with a history of Lewy body dementia presents to the ER for evaluation near syncopal episode that occurred while she was walking to get on the scale at [facility] today.  She felt lightheaded reportedly was briefly confused.  Required assistance back to a seated position.  Did not fall.  No LOC.  Poorly has had poor p.o. intake.  No new medications.  No report of any slurred speech or weakness.  No nausea or vomiting.  No chest pain or shortness of breath.     Physical Exam   Triage Vital Signs: ED Triage Vitals  Enc Vitals Group     BP 11/22/22 1153 (!) 101/49     Pulse Rate 11/22/22 1153 64     Resp 11/22/22 1153 16     Temp 11/22/22 1153 97.8 F (36.6 C)     Temp Source 11/22/22 1153 Oral     SpO2 11/22/22 1153 96 %     Weight 11/22/22 1156 130 lb (59 kg)     Height 11/22/22 1156 5\' 3"  (1.6 m)     Head Circumference --      Peak Flow --      Pain Score 11/22/22 1156 3     Pain Loc --      Pain Edu? --      Excl. in [place]? --     Most recent vital signs: Vitals:   11/22/22 1454 11/22/22 1519  BP:  (!) 148/71  Pulse: 67 61  Resp: 16 16  Temp:  97.7 F (36.5 C)  SpO2: 100% 100%     Constitutional: Alert  Eyes: Conjunctivae are normal.  Head: Atraumatic. Nose: No congestion/rhinnorhea. Mouth/Throat: Mucous membranes are moist.   Neck: Painless ROM.  Cardiovascular:   Good peripheral circulation. Respiratory: Normal respiratory effort.  No retractions.  Gastrointestinal: Soft and nontender.  Musculoskeletal:  no deformity Neurologic:  MAE spontaneously. No gross focal neurologic deficits are appreciated.  Skin:  Skin is warm, dry and intact. No rash noted. Psychiatric: calm and cooperative    ED Results / Procedures / Treatments    Labs (all labs ordered are listed, but only abnormal results are displayed) Labs Reviewed  BASIC METABOLIC PANEL - Abnormal; Notable for the following components:      Result Value   CO2 21 (*)    Glucose, Bld 120 (*)    All other components within normal limits  CBC  URINALYSIS, ROUTINE W REFLEX MICROSCOPIC  CBG MONITORING, ED  TROPONIN I (HIGH SENSITIVITY)     EKG  ED ECG REPORT I, Merlyn Lot, the attending physician, personally viewed and interpreted this ECG.   Date: 11/22/2022  EKG Time: 11:58  Rate: 60  Rhythm: sinus  Axis: normal  Intervals: nonspecific st abn  ST&T Change: no stemi    RADIOLOGY Please see ED Course for my review and interpretation.  I personally reviewed all radiographic images ordered to evaluate for the above acute complaints and reviewed radiology reports and findings.  These findings were personally discussed with the patient.  Please see medical record for radiology report.    PROCEDURES:  Critical Care performed: No  Procedures   MEDICATIONS ORDERED IN ED: Medications  sodium chloride 0.9 % bolus 1,000 mL (  0 mLs Intravenous Stopped 11/22/22 1519)     IMPRESSION / MDM / ASSESSMENT AND PLAN / ED COURSE  I reviewed the triage vital signs and the nursing notes.                              Differential diagnosis includes, but is not limited to, dehydration, anemia, acs, dysrhythmia, sepsis, pna, uti, vasovagal  Patient presenting to the ER for evaluation of symptoms as described above.  Based on symptoms, risk factors and considered above differential, this presenting complaint could reflect a potentially life-threatening illness therefore the patient will be placed on continuous pulse oximetry and telemetry for monitoring.  Laboratory evaluation will be sent to evaluate for the above complaints.      Clinical Course as of 11/22/22 1528  Wed Nov 22, 2022  1350 Chest x-ray my review and interpretation without evidence  of infiltrate or effusions. [PR]  1508 Patient reassessed.  Feels significantly improved.  Tolerating p.o.  Her urine is still pending.  Discussed option for further observation additional fluids follow-up urine but has been states that she is feeling well after fluids she is no longer orthostatic able to ambulate without assistance denies any report of fever or symptoms of UTI.  Feels comfortable following up with PCP.  Reports poor p.o. intake secondary to her dementia and that he has to frequently encourage her to drink fluids.  Do think that outpatient follow-up is reasonable. [PR]    Clinical Course User Index [PR] Merlyn Lot, MD   Prior to discharge added on troponin given her age and risk factors she denies any chest pain or pressure.  Patient be signed out to oncoming physician pending follow-up troponin if negative do believe patient be appropriate for outpatient follow-up.   FINAL CLINICAL IMPRESSION(S) / ED DIAGNOSES   Final diagnoses:  Dehydration     Rx / DC Orders   ED Discharge Orders     None        Note:  This document was prepared using Dragon voice recognition software and may include unintentional dictation errors.    Merlyn Lot, MD 11/22/22 [phone]

## 2022-11-22 NOTE — ED Notes (Signed)
Pt up to restroom with NT. Was unaware pt needed urine sample collection so not collected though pt did urinate. Pt assisted back to bed by NT.

## 2022-11-22 NOTE — ED Triage Notes (Signed)
Pt here with a low blood pressure from [place]. Pt passed out on the way to the scale at [facility]. Pt states she feels weak. Pt was there for a GI follow up for constipation. Pt threw up phlegm this morning.

## 2022-11-22 NOTE — ED Provider Notes (Signed)
Emergency department handoff note  Care of this patient was signed out to me at the end of the previous provider shift.  All pertinent patient information was conveyed and all questions were answered.  Patient pending troponin which has come back negative.  The patient has been reexamined and is ready to be discharged.  All diagnostic results have been reviewed and discussed with the patient/family.  Care plan has been outlined and the patient/family understands all current diagnoses, results, and treatment plans.  There are no new complaints, changes, or physical findings at this time.  All questions have been addressed and answered.  Patient was instructed to, and agrees to follow-up with their primary care physician as well as return to the emergency department if any new or worsening symptoms develop.   Naaman Plummer, MD 11/22/22 [phone]

## 2022-11-22 NOTE — ED Notes (Signed)
[name] husband signed for her d/c paperwork.

## 2022-11-22 NOTE — ED Provider Triage Note (Signed)
Emergency Medicine Provider Triage Evaluation Note  Barbara Wilkins , a 77 y.o. female  was evaluated in triage.  Pt complains of not feeling well.  Patient brought from [facility] with low blood pressure and reported passed out on the way to the scale while in the clinic.  Patient was therefore GI follow-up for constipation.  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  Review of Systems  Positive:  Negative: No upper respiratory symptoms.  Physical Exam  BP (!) 101/49 (BP Location: Left Arm)   Pulse 64   Temp 97.8 F (36.6 C) (Oral)   Resp 16   Ht 5\' 3"  (1.6 m)   Wt 59 kg   SpO2 96%   BMI 23.03 kg/m  Gen:   Awake, no distress   verbal with short answers when asked by family members. Resp:  Normal effort  MSK:   Moves extremities without difficulty  Other:    Medical Decision Making  Medically screening exam initiated at 11:59 AM.  Appropriate orders placed.  Hartley Wyke Nodine was informed that the remainder of the evaluation will be completed by another provider, this initial triage assessment does not replace that evaluation, and the importance of remaining in the ED until their evaluation is complete.     Johnn Hai, PA-C 11/22/22 1202

## 2022-11-22 NOTE — ED Notes (Signed)
First Nurse Note: Patient sent from [facility] GI for low BP. Patient was initially being seen for constipation. Patient was given crackers and fluids at [facility]. Hx of dementia. 74/49 with KC.

## 2023-08-14 ENCOUNTER — Ambulatory Visit (INDEPENDENT_AMBULATORY_CARE_PROVIDER_SITE_OTHER): Payer: Medicare HMO | Admitting: Dermatology

## 2023-08-14 ENCOUNTER — Encounter: Payer: Self-pay | Admitting: Dermatology

## 2023-08-14 VITALS — BP 107/68 | HR 73

## 2023-08-14 DIAGNOSIS — W908XXA Exposure to other nonionizing radiation, initial encounter: Secondary | ICD-10-CM | POA: Diagnosis not present

## 2023-08-14 DIAGNOSIS — L570 Actinic keratosis: Secondary | ICD-10-CM

## 2023-08-14 DIAGNOSIS — L578 Other skin changes due to chronic exposure to nonionizing radiation: Secondary | ICD-10-CM | POA: Diagnosis not present

## 2023-08-14 DIAGNOSIS — L820 Inflamed seborrheic keratosis: Secondary | ICD-10-CM | POA: Diagnosis not present

## 2023-08-14 DIAGNOSIS — D692 Other nonthrombocytopenic purpura: Secondary | ICD-10-CM | POA: Diagnosis not present

## 2023-08-14 DIAGNOSIS — L814 Other melanin hyperpigmentation: Secondary | ICD-10-CM

## 2023-08-14 DIAGNOSIS — L821 Other seborrheic keratosis: Secondary | ICD-10-CM

## 2023-08-14 NOTE — Patient Instructions (Addendum)
Seborrheic Keratosis  What causes seborrheic keratoses? Seborrheic keratoses are harmless, common skin growths that first appear during adult life.  As time goes by, more growths appear.  Some people may develop a large number of them.  Seborrheic keratoses appear on both covered and uncovered body parts.  They are not caused by sunlight.  The tendency to develop seborrheic keratoses can be inherited.  They vary in color from skin-colored to gray, brown, or even black.  They can be either smooth or have a rough, warty surface.   Seborrheic keratoses are superficial and look as if they were stuck on the skin.  Under the microscope this type of keratosis looks like layers upon layers of skin.  That is why at times the top layer may seem to fall off, but the rest of the growth remains and re-grows.    Treatment Seborrheic keratoses do not need to be treated, but can easily be removed in the office.  Seborrheic keratoses often cause symptoms when they rub on clothing or jewelry.  Lesions can be in the way of shaving.  If they become inflamed, they can cause itching, soreness, or burning.  Removal of a seborrheic keratosis can be accomplished by freezing, burning, or surgery. If any spot bleeds, scabs, or grows rapidly, please return to have it checked, as these can be an indication of a skin cancer.  Cryotherapy Aftercare  Wash gently with soap and water everyday.   Apply Vaseline and Band-Aid daily until healed.   Actinic keratoses are precancerous spots that appear secondary to cumulative UV radiation exposure/sun exposure over time. They are chronic with expected duration over 1 year. A portion of actinic keratoses will progress to squamous cell carcinoma of the skin. It is not possible to reliably predict which spots will progress to skin cancer and so treatment is recommended to prevent development of skin cancer.  Recommend daily broad spectrum sunscreen SPF 30+ to sun-exposed areas, reapply every  2 hours as needed.  Recommend staying in the shade or wearing long sleeves, sun glasses (UVA+UVB protection) and wide brim hats (4-inch brim around the entire circumference of the hat). Call for new or changing lesions.      Due to recent changes in healthcare laws, you may see results of your pathology and/or laboratory studies on MyChart before the doctors have had a chance to review them. We understand that in some cases there may be results that are confusing or concerning to you. Please understand that not all results are received at the same time and often the doctors may need to interpret multiple results in order to provide you with the best plan of care or course of treatment. Therefore, we ask that you please give [place] 2 business days to thoroughly review all your results before contacting the office for clarification. Should we see a critical lab result, you will be contacted sooner.   If You Need Anything After Your Visit  If you have any questions or concerns for your doctor, please call our main line at [phone] and press option 4 to reach your doctor's medical assistant. If no one answers, please leave a voicemail as directed and we will return your call as soon as possible. Messages left after 4 pm will be answered the following business day.   You may also send [place] a message via MyChart. We typically respond to MyChart messages within 1-2 business days.  For prescription refills, please ask your pharmacy to contact our office. Our  fax number is [phone].  If you have an urgent issue when the clinic is closed that cannot wait until the next business day, you can page your doctor at the number below.    Please note that while we do our best to be available for urgent issues outside of office hours, we are not available 24/7.   If you have an urgent issue and are unable to reach [place], you may choose to seek medical care at your doctor's office, retail clinic, urgent care  center, or emergency room.  If you have a medical emergency, please immediately call 911 or go to the emergency department.  Pager Numbers  - Dr. Gwen Pounds: [phone]  - Dr. Roseanne Reno: [phone]  - Dr. Katrinka Blazing: [phone]   In the event of inclement weather, please call our main line at [phone] for an update on the status of any delays or closures.  Dermatology Medication Tips: Please keep the boxes that topical medications come in in order to help keep track of the instructions about where and how to use these. Pharmacies typically print the medication instructions only on the boxes and not directly on the medication tubes.   If your medication is too expensive, please contact our office at [phone] option 4 or send [place] a message through MyChart.   We are unable to tell what your co-pay for medications will be in advance as this is different depending on your insurance coverage. However, we may be able to find a substitute medication at lower cost or fill out paperwork to get insurance to cover a needed medication.   If a prior authorization is required to get your medication covered by your insurance company, please allow [place] 1-2 business days to complete this process.  Drug prices often vary depending on where the prescription is filled and some pharmacies may offer cheaper prices.  The website www.goodrx.com contains coupons for medications through different pharmacies. The prices here do not account for what the cost may be with help from insurance (it may be cheaper with your insurance), but the website can give you the price if you did not use any insurance.  - You can print the associated coupon and take it with your prescription to the pharmacy.  - You may also stop by our office during regular business hours and pick up a GoodRx coupon card.  - If you need your prescription sent electronically to a different pharmacy, notify our office through [facility] or by  phone at [phone] option 4.     Si Usted Necesita Algo Despus de Su Visita  Tambin puede enviarnos un mensaje a travs de Clinical cytogeneticist. Por lo general respondemos a los mensajes de MyChart en el transcurso de 1 a 2 das hbiles.  Para renovar recetas, por favor pida a su farmacia que se ponga en contacto con nuestra oficina. Annie Sable de fax es [place] [phone].  Si tiene un asunto urgente cuando la clnica est cerrada y que no puede esperar hasta el siguiente da hbil, puede llamar/localizar a su doctor(a) al nmero que aparece a continuacin.   Por favor, tenga en cuenta que aunque hacemos todo lo posible para estar disponibles para asuntos urgentes fuera del horario de [place], no estamos disponibles las 24 horas del da, los 7 [address]  [address] de la [place].   Si tiene un problema urgente y no puede comunicarse con nosotros, puede optar por buscar atencin mdica  en el consultorio de su doctor(a), en una clnica privada, en un  centro de atencin urgente o en una sala de emergencias.  Si tiene Engineer, drilling, por favor llame inmediatamente al 911 o vaya a la sala de emergencias.  Nmeros de bper  - Dr. Gwen Pounds: [phone]  - Dra. Roseanne Reno: [phone]  - Dr. Katrinka Blazing: [phone]   En caso de inclemencias del tiempo, por favor llame a Lacy Duverney principal al [phone] para una actualizacin sobre el [place] de cualquier retraso o cierre.  Consejos para la medicacin en dermatologa: Por favor, guarde las cajas en las que vienen los medicamentos de uso tpico para ayudarle a seguir las instrucciones sobre dnde y cmo usarlos. Las farmacias generalmente imprimen las instrucciones del medicamento slo en las cajas y no directamente en los tubos del [place].   Si su medicamento es muy caro, por favor, pngase en contacto con Rolm Gala llamando al [phone] y presione la opcin 4 o envenos un mensaje a travs de Clinical cytogeneticist.   No podemos decirle cul ser su copago  por los medicamentos por adelantado ya que esto es diferente dependiendo de la cobertura de su seguro. Sin embargo, es posible que podamos encontrar un medicamento sustituto a Audiological scientist un formulario para que el seguro cubra el medicamento que se considera necesario.   Si se requiere una autorizacin previa para que su compaa de seguros [place] su medicamento, por favor permtanos de 1 a 2 das hbiles para completar [address]  Los precios de los medicamentos varan con frecuencia dependiendo del Environmental consultant de dnde se surte la receta y alguna farmacias pueden ofrecer precios ms baratos.  El sitio web www.goodrx.com tiene cupones para medicamentos de Health and safety inspector. Los precios aqu no tienen en cuenta lo que podra costar con la ayuda del seguro (puede ser ms barato con su seguro), pero el sitio web puede darle el precio si no utiliz Tourist information centre manager.  - Puede imprimir el cupn correspondiente y llevarlo con su receta a la farmacia.  - Tambin puede pasar por nuestra oficina durante el horario de atencin regular y Education officer, museum una tarjeta de cupones de GoodRx.  - Si necesita que su receta se enve electrnicamente a una farmacia diferente, informe a nuestra oficina a travs de MyChart de [place] o por telfono llamando al [phone] y presione la opcin 4.

## 2023-08-14 NOTE — Progress Notes (Signed)
Follow-Up Visit   Subjective  Barbara Wilkins is a 77 y.o. female who presents for the following: patient here with husband for ak and isk follow up. Patient had history of aks at left and right cheek.   Husband is with patient and contributes to history.  The patient has spots, moles and lesions to be evaluated, some may be new or changing and the patient may have concern these could be cancer.   The following portions of the chart were reviewed this encounter and updated as appropriate: medications, allergies, medical history  Review of Systems:  No other skin or systemic complaints except as noted in HPI or Assessment and Plan.  Objective  Well appearing patient in no apparent distress; mood and affect are within normal limits.   A focused examination was performed of the following areas: Face, back, arms, hands  Relevant exam findings are noted in the Assessment and Plan.  face x 20 (20) Erythematous stuck-on, waxy papule or plaque  face x 2 (2) Erythematous thin papules/macules with gritty scale.     Assessment & Plan   SEBORRHEIC KERATOSIS - Stuck-on, waxy, tan-brown papules and/or plaques  - Benign-appearing - Discussed benign etiology and prognosis. - Observe - Call for any changes  LENTIGINES Exam: scattered tan macules Due to sun exposure Treatment Plan: Benign-appearing, observe. Recommend daily broad spectrum sunscreen SPF 30+ to sun-exposed areas, reapply every 2 hours as needed.  Call for any changes   Purpura - Chronic; persistent and recurrent.  Treatable, but not curable. - Violaceous macules and patches - Benign - Related to trauma, age, sun damage and/or use of blood thinners, chronic use of topical and/or oral steroids - Observe - Can use OTC arnica containing moisturizer such as Dermend Bruise Formula if desired - Call for worsening or other concerns   ACTINIC DAMAGE - chronic, secondary to cumulative UV radiation exposure/sun exposure  over time - diffuse scaly erythematous macules with underlying dyspigmentation - Recommend daily broad spectrum sunscreen SPF 30+ to sun-exposed areas, reapply every 2 hours as needed.  - Recommend staying in the shade or wearing long sleeves, sun glasses (UVA+UVB protection) and wide brim hats (4-inch brim around the entire circumference of the hat). - Call for new or changing lesions.  Inflamed seborrheic keratosis (20) face x 20  Symptomatic, irritating, patient would like treated.  Destruction of lesion - face x 20 (20) Complexity: simple   Destruction method: cryotherapy   Informed consent: discussed and consent obtained   Timeout:  patient name, date of birth, surgical site, and procedure verified Lesion destroyed using liquid nitrogen: Yes   Region frozen until ice ball extended beyond lesion: Yes   Outcome: patient tolerated procedure well with no complications   Post-procedure details: wound care instructions given    Actinic keratosis (2) face x 2  Actinic keratoses are precancerous spots that appear secondary to cumulative UV radiation exposure/sun exposure over time. They are chronic with expected duration over 1 year. A portion of actinic keratoses will progress to squamous cell carcinoma of the skin. It is not possible to reliably predict which spots will progress to skin cancer and so treatment is recommended to prevent development of skin cancer.  Recommend daily broad spectrum sunscreen SPF 30+ to sun-exposed areas, reapply every 2 hours as needed.  Recommend staying in the shade or wearing long sleeves, sun glasses (UVA+UVB protection) and wide brim hats (4-inch brim around the entire circumference of the hat). Call for new or changing  lesions.  Destruction of lesion - face x 2 (2) Complexity: simple   Destruction method: cryotherapy   Informed consent: discussed and consent obtained   Timeout:  patient name, date of birth, surgical site, and procedure  verified Lesion destroyed using liquid nitrogen: Yes   Region frozen until ice ball extended beyond lesion: Yes   Outcome: patient tolerated procedure well with no complications   Post-procedure details: wound care instructions given      Return in about 1 year (around 08/13/2024) for ak and isk follow up.  IAsher Muir, CMA, am acting as scribe for Armida Sans, MD.   Documentation: I have reviewed the above documentation for accuracy and completeness, and I agree with the above.  Armida Sans, MD

## 2024-04-25 ENCOUNTER — Inpatient Hospital Stay
Admission: EM | Admit: 2024-04-25 | Discharge: 2024-04-27 | DRG: 871 | Disposition: A | Discharge status: Hospice | Payer: Medicare (Managed Care) | Attending: Family Medicine | Admitting: Family Medicine

## 2024-04-25 ENCOUNTER — Other Ambulatory Visit: Payer: Self-pay

## 2024-04-25 ENCOUNTER — Emergency Department: Payer: Medicare (Managed Care)

## 2024-04-25 DIAGNOSIS — K219 Gastro-esophageal reflux disease without esophagitis: Secondary | ICD-10-CM | POA: Diagnosis present

## 2024-04-25 DIAGNOSIS — G43909 Migraine, unspecified, not intractable, without status migrainosus: Secondary | ICD-10-CM | POA: Diagnosis present

## 2024-04-25 DIAGNOSIS — A419 Sepsis, unspecified organism: Principal | ICD-10-CM | POA: Diagnosis present

## 2024-04-25 DIAGNOSIS — R4182 Altered mental status, unspecified: Secondary | ICD-10-CM | POA: Diagnosis not present

## 2024-04-25 DIAGNOSIS — F02811 Dementia in other diseases classified elsewhere, unspecified severity, with agitation: Secondary | ICD-10-CM

## 2024-04-25 DIAGNOSIS — Z79899 Other long term (current) drug therapy: Secondary | ICD-10-CM

## 2024-04-25 DIAGNOSIS — J189 Pneumonia, unspecified organism: Principal | ICD-10-CM | POA: Diagnosis present

## 2024-04-25 DIAGNOSIS — R296 Repeated falls: Secondary | ICD-10-CM | POA: Diagnosis present

## 2024-04-25 DIAGNOSIS — Z9181 History of falling: Secondary | ICD-10-CM

## 2024-04-25 DIAGNOSIS — G3183 Dementia with Lewy bodies: Secondary | ICD-10-CM | POA: Diagnosis present

## 2024-04-25 DIAGNOSIS — F03918 Unspecified dementia, unspecified severity, with other behavioral disturbance: Secondary | ICD-10-CM | POA: Insufficient documentation

## 2024-04-25 DIAGNOSIS — Z66 Do not resuscitate: Secondary | ICD-10-CM | POA: Diagnosis not present

## 2024-04-25 DIAGNOSIS — Z515 Encounter for palliative care: Secondary | ICD-10-CM

## 2024-04-25 DIAGNOSIS — K5900 Constipation, unspecified: Secondary | ICD-10-CM | POA: Diagnosis present

## 2024-04-25 DIAGNOSIS — F02818 Dementia in other diseases classified elsewhere, unspecified severity, with other behavioral disturbance: Secondary | ICD-10-CM | POA: Diagnosis present

## 2024-04-25 DIAGNOSIS — G9341 Metabolic encephalopathy: Secondary | ICD-10-CM | POA: Diagnosis present

## 2024-04-25 NOTE — ED Triage Notes (Signed)
 Pt. Brought in via POV by daughter and husband for high fever at home, reports 101.2 axillary temp at 2230, daughter states decreased LOC, immobile today, won't talk today, would not respond to sternal rub by daughter who is CRNA, also a wet cough

## 2024-04-26 ENCOUNTER — Emergency Department: Payer: Medicare (Managed Care)

## 2024-04-26 DIAGNOSIS — G9341 Metabolic encephalopathy: Secondary | ICD-10-CM | POA: Diagnosis present

## 2024-04-26 DIAGNOSIS — G43909 Migraine, unspecified, not intractable, without status migrainosus: Secondary | ICD-10-CM | POA: Diagnosis present

## 2024-04-26 DIAGNOSIS — J189 Pneumonia, unspecified organism: Secondary | ICD-10-CM | POA: Diagnosis present

## 2024-04-26 DIAGNOSIS — Z515 Encounter for palliative care: Secondary | ICD-10-CM

## 2024-04-26 DIAGNOSIS — Z9181 History of falling: Secondary | ICD-10-CM | POA: Diagnosis not present

## 2024-04-26 DIAGNOSIS — Z79899 Other long term (current) drug therapy: Secondary | ICD-10-CM | POA: Diagnosis not present

## 2024-04-26 DIAGNOSIS — F03918 Unspecified dementia, unspecified severity, with other behavioral disturbance: Secondary | ICD-10-CM | POA: Insufficient documentation

## 2024-04-26 DIAGNOSIS — A419 Sepsis, unspecified organism: Secondary | ICD-10-CM | POA: Diagnosis present

## 2024-04-26 DIAGNOSIS — K219 Gastro-esophageal reflux disease without esophagitis: Secondary | ICD-10-CM | POA: Diagnosis present

## 2024-04-26 DIAGNOSIS — R296 Repeated falls: Secondary | ICD-10-CM | POA: Diagnosis present

## 2024-04-26 DIAGNOSIS — Z7189 Other specified counseling: Secondary | ICD-10-CM | POA: Diagnosis not present

## 2024-04-26 DIAGNOSIS — K5900 Constipation, unspecified: Secondary | ICD-10-CM | POA: Insufficient documentation

## 2024-04-26 DIAGNOSIS — Z66 Do not resuscitate: Secondary | ICD-10-CM | POA: Diagnosis not present

## 2024-04-26 DIAGNOSIS — G3183 Dementia with Lewy bodies: Secondary | ICD-10-CM | POA: Diagnosis present

## 2024-04-26 DIAGNOSIS — F02818 Dementia in other diseases classified elsewhere, unspecified severity, with other behavioral disturbance: Secondary | ICD-10-CM | POA: Diagnosis present

## 2024-04-26 DIAGNOSIS — R4182 Altered mental status, unspecified: Secondary | ICD-10-CM | POA: Diagnosis present

## 2024-04-26 LAB — CBC WITH DIFFERENTIAL/PLATELET
Abs Immature Granulocytes: 0.06 10*3/uL (ref 0.00–0.07)
Basophils Absolute: 0 10*3/uL (ref 0.0–0.1)
Basophils Relative: 0 %
Eosinophils Absolute: 0 10*3/uL (ref 0.0–0.5)
Eosinophils Relative: 0 %
HCT: 37.8 % (ref 36.0–46.0)
Hemoglobin: 12.1 g/dL (ref 12.0–15.0)
Immature Granulocytes: 1 %
Lymphocytes Relative: 12 %
Lymphs Abs: 1.5 10*3/uL (ref 0.7–4.0)
MCH: 29 pg (ref 26.0–34.0)
MCHC: 32 g/dL (ref 30.0–36.0)
MCV: 90.6 fL (ref 80.0–100.0)
Monocytes Absolute: 0.9 10*3/uL (ref 0.1–1.0)
Monocytes Relative: 7 %
Neutro Abs: 9.7 10*3/uL — ABNORMAL HIGH (ref 1.7–7.7)
Neutrophils Relative %: 80 %
Platelets: 241 10*3/uL (ref 150–400)
RBC: 4.17 MIL/uL (ref 3.87–5.11)
RDW: 13.7 % (ref 11.5–15.5)
WBC: 12.2 10*3/uL — ABNORMAL HIGH (ref 4.0–10.5)
nRBC: 0 % (ref 0.0–0.2)

## 2024-04-26 LAB — URINALYSIS, W/ REFLEX TO CULTURE (INFECTION SUSPECTED)
Bacteria, UA: NONE SEEN
Bilirubin Urine: NEGATIVE
Glucose, UA: NEGATIVE mg/dL
Hgb urine dipstick: NEGATIVE
Ketones, ur: NEGATIVE mg/dL
Leukocytes,Ua: NEGATIVE
Nitrite: NEGATIVE
Protein, ur: NEGATIVE mg/dL
Specific Gravity, Urine: 1.023 (ref 1.005–1.030)
pH: 7 (ref 5.0–8.0)

## 2024-04-26 LAB — LACTIC ACID, PLASMA
Lactic Acid, Venous: 1.9 mmol/L (ref 0.5–1.9)
Lactic Acid, Venous: 2.6 mmol/L (ref 0.5–1.9)
Lactic Acid, Venous: 2.2 mmol/L (ref 0.5–1.9)

## 2024-04-26 LAB — RESP PANEL BY RT-PCR (RSV, FLU A&B, COVID)  RVPGX2
Influenza A by PCR: NEGATIVE
Influenza B by PCR: NEGATIVE
Resp Syncytial Virus by PCR: NEGATIVE
SARS Coronavirus 2 by RT PCR: NEGATIVE

## 2024-04-26 LAB — COMPREHENSIVE METABOLIC PANEL WITH GFR
ALT: 12 U/L (ref 0–44)
AST: 21 U/L (ref 15–41)
Albumin: 3.5 g/dL (ref 3.5–5.0)
Alkaline Phosphatase: 75 U/L (ref 38–126)
Anion gap: 9 (ref 5–15)
BUN: 22 mg/dL (ref 8–23)
CO2: 23 mmol/L (ref 22–32)
Calcium: 8.8 mg/dL — ABNORMAL LOW (ref 8.9–10.3)
Chloride: 107 mmol/L (ref 98–111)
Creatinine, Ser: 0.64 mg/dL (ref 0.44–1.00)
GFR, Estimated: >60 mL/min (ref >60)
Glucose, Bld: 104 mg/dL — ABNORMAL HIGH (ref 70–99)
Potassium: 3.6 mmol/L (ref 3.5–5.1)
Sodium: 139 mmol/L (ref 135–145)
Total Bilirubin: 1 mg/dL (ref 0.0–1.2)
Total Protein: 7 g/dL (ref 6.5–8.1)

## 2024-04-26 LAB — BRAIN NATRIURETIC PEPTIDE: B Natriuretic Peptide: 31 pg/mL (ref 0.0–100.0)

## 2024-04-26 LAB — TROPONIN I (HIGH SENSITIVITY)
Troponin I (High Sensitivity): 7 ng/L (ref <18)
Troponin I (High Sensitivity): 7 ng/L (ref <18)

## 2024-04-26 MED ORDER — LACTATED RINGERS IV BOLUS (SEPSIS)
1000.0000 mL | Freq: Once | INTRAVENOUS | Status: AC
  Administered 2024-04-26: 1000 mL via INTRAVENOUS

## 2024-04-26 MED ORDER — HALOPERIDOL LACTATE 5 MG/ML IJ SOLN
1.0000 mg | Freq: Four times a day (QID) | INTRAMUSCULAR | Status: DC | PRN
  Administered 2024-04-26: 1 mg via INTRAVENOUS
  Filled 2024-04-26: qty 1

## 2024-04-26 MED ORDER — ONDANSETRON HCL 4 MG PO TABS
4.0000 mg | ORAL_TABLET | Freq: Four times a day (QID) | ORAL | Status: AC | PRN
Start: 2024-04-26 — End: ?

## 2024-04-26 MED ORDER — LORAZEPAM 2 MG/ML IJ SOLN
1.0000 mg | Freq: Four times a day (QID) | INTRAMUSCULAR | Status: AC | PRN
  Administered 2024-04-26: 1 mg via INTRAVENOUS
  Filled 2024-04-26: qty 1

## 2024-04-26 MED ORDER — POLYVINYL ALCOHOL 1.4 % OP SOLN: 1.0000 [drp] | Freq: Four times a day (QID) | OPHTHALMIC | Status: DC | PRN

## 2024-04-26 MED ORDER — GLYCOPYRROLATE 0.2 MG/ML IJ SOLN: 0.2000 mg | INTRAMUSCULAR | Status: DC | PRN

## 2024-04-26 MED ORDER — SODIUM CHLORIDE 0.9 % IV SOLN
500.0000 mg | INTRAVENOUS | Status: DC
  Filled 2024-04-26: qty 5

## 2024-04-26 MED ORDER — ACETAMINOPHEN 325 MG PO TABS
650.0000 mg | ORAL_TABLET | Freq: Four times a day (QID) | ORAL | Status: AC | PRN
Start: 2024-04-26 — End: ?
  Filled 2024-04-26: qty 2

## 2024-04-26 MED ORDER — VANCOMYCIN HCL IN DEXTROSE 1-5 GM/200ML-% IV SOLN
1000.0000 mg | Freq: Once | INTRAVENOUS | Status: AC
  Administered 2024-04-26: 1000 mg via INTRAVENOUS
  Filled 2024-04-26: qty 200

## 2024-04-26 MED ORDER — GLYCOPYRROLATE 1 MG PO TABS: 1.0000 mg | ORAL_TABLET | ORAL | Status: DC | PRN

## 2024-04-26 MED ORDER — ACETAMINOPHEN 500 MG PO TABS: 1000.0000 mg | ORAL_TABLET | Freq: Once | ORAL | Status: DC

## 2024-04-26 MED ORDER — ZIPRASIDONE MESYLATE 20 MG IM SOLR
10.0000 mg | Freq: Two times a day (BID) | INTRAMUSCULAR | Status: DC | PRN
  Administered 2024-04-26 – 2024-04-27 (×2): 10 mg via INTRAMUSCULAR
  Filled 2024-04-26 (×5): qty 20

## 2024-04-26 MED ORDER — GUAIFENESIN-DM 100-10 MG/5ML PO SYRP: 10.0000 mL | ORAL_SOLUTION | ORAL | Status: DC | PRN

## 2024-04-26 MED ORDER — MORPHINE SULFATE (PF) 2 MG/ML IV SOLN
1.0000 mg | INTRAVENOUS | Status: DC | PRN
  Administered 2024-04-26 (×2): 1 mg via INTRAVENOUS
  Filled 2024-04-26 (×2): qty 1

## 2024-04-26 MED ORDER — LORAZEPAM 2 MG/ML IJ SOLN
0.5000 mg | Freq: Once | INTRAMUSCULAR | Status: AC | PRN
  Administered 2024-04-26: 0.5 mg via INTRAVENOUS
  Filled 2024-04-26: qty 1

## 2024-04-26 MED ORDER — DROPERIDOL 2.5 MG/ML IJ SOLN
1.2500 mg | Freq: Once | INTRAMUSCULAR | Status: AC
  Administered 2024-04-26: 1.25 mg via INTRAVENOUS

## 2024-04-26 MED ORDER — LACTATED RINGERS IV BOLUS (SEPSIS)
500.0000 mL | Freq: Once | INTRAVENOUS | Status: AC
  Administered 2024-04-26: 500 mL via INTRAVENOUS

## 2024-04-26 MED ORDER — ENOXAPARIN SODIUM 30 MG/0.3ML IJ SOSY: 30.0000 mg | PREFILLED_SYRINGE | INTRAMUSCULAR | Status: DC

## 2024-04-26 MED ORDER — ONDANSETRON HCL 4 MG/2ML IJ SOLN: 4.0000 mg | Freq: Four times a day (QID) | INTRAMUSCULAR | Status: DC | PRN

## 2024-04-26 MED ORDER — LORAZEPAM 2 MG/ML IJ SOLN
1.0000 mg | Freq: Once | INTRAMUSCULAR | Status: AC | PRN
Start: 2024-04-26 — End: 2024-04-26
  Administered 2024-04-26: 1 mg via INTRAVENOUS
  Filled 2024-04-26: qty 1

## 2024-04-26 MED ORDER — ALBUTEROL SULFATE (2.5 MG/3ML) 0.083% IN NEBU
2.5000 mg | INHALATION_SOLUTION | RESPIRATORY_TRACT | Status: AC | PRN
Start: 2024-04-26 — End: ?

## 2024-04-26 MED ORDER — MORPHINE SULFATE (PF) 2 MG/ML IV SOLN
2.0000 mg | INTRAVENOUS | Status: DC | PRN
  Administered 2024-04-26 – 2024-04-27 (×8): 2 mg via INTRAVENOUS
  Filled 2024-04-26 (×8): qty 1

## 2024-04-26 MED ORDER — DROPERIDOL 2.5 MG/ML IJ SOLN
1.2500 mg | Freq: Once | INTRAMUSCULAR | Status: AC
  Administered 2024-04-26: 1.25 mg via INTRAVENOUS
  Filled 2024-04-26: qty 2

## 2024-04-26 MED ORDER — SODIUM CHLORIDE 0.9 % IV SOLN
2.0000 g | Freq: Once | INTRAVENOUS | Status: AC
  Filled 2024-04-26: qty 12.5

## 2024-04-26 MED ORDER — ACETAMINOPHEN 650 MG RE SUPP
650.0000 mg | Freq: Four times a day (QID) | RECTAL | Status: DC | PRN
  Administered 2024-04-26 – 2024-04-27 (×2): 650 mg via RECTAL
  Filled 2024-04-26 (×2): qty 1

## 2024-04-26 MED ORDER — IOHEXOL 300 MG/ML  SOLN
80.0000 mL | Freq: Once | INTRAMUSCULAR | Status: AC | PRN
  Administered 2024-04-26: 80 mL via INTRAVENOUS

## 2024-04-26 MED ORDER — LACTATED RINGERS IV SOLN
150.0000 mL/h | INTRAVENOUS | Status: DC
Start: 2024-04-26 — End: 2024-04-26
  Administered 2024-04-26: 150 mL/h via INTRAVENOUS

## 2024-04-26 MED ORDER — SODIUM CHLORIDE 0.9 % IV SOLN
2.0000 g | INTRAVENOUS | Status: DC
  Filled 2024-04-26: qty 20

## 2024-04-26 MED ORDER — ACETAMINOPHEN 325 MG RE SUPP
650.0000 mg | Freq: Once | RECTAL | Status: AC
  Administered 2024-04-26: 650 mg via RECTAL
  Filled 2024-04-26: qty 2

## 2024-04-26 NOTE — Sepsis Progress Note (Signed)
 Elink monitoring for the code sepsis protocol.

## 2024-04-26 NOTE — Assessment & Plan Note (Addendum)
 Sepsis criteria include fever, tachycardia, leukocytosis, acute encephalopathy Continue sepsis fluids Rocephin and azithromycin Antitussives, DuoNebs as needed Supplemental oxygen if needed Aspiration precautions Will keep n.p.o. until more alert-daughter at bedside who is a retired Scientist, clinical (histocompatibility and immunogenetics) does not want patient evaluated by speech therapy Follow cultures

## 2024-04-26 NOTE — Progress Notes (Signed)
   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  Patient was initially started on broad-spectrum antibiotics and admitted to the floor for IV fluids, antibiotic therapy, and telemetry monitoring.  At the time of my evaluation this morning the patient's daughter, husband, and caregiver are at bedside.  The patient is lying in the fetal position and moaning.  Her family has discontinued the patient's IV fluids prior to my arrival.  They have been refusing all lab draws. The patient's husband reports that he would like to stop all medical intervention and focus instead on the patient's comfort.  We discussed hospice and palliative care.  We discussed that discontinuing antibiotics and IV fluids would be choosing hospice care.  He endorses understanding.  He reports he desires for his wife to be comfortable and to receive medication for pain and agitation.  Patient has been made comfort measures only. I have discussed change in care with bedside RN. We will discontinue all medications that do not provide immediate comfort.  Will discontinue all lab draws.  Turn off alarms.  Q shift vitals only. Palliative care and hospice were consulted. TOC consulted to help arrange home hospice.     04/26/2024    4:30 AM 04/26/2024    3:19 AM 04/26/2024    2:53 AM  Vitals with BMI  Systolic 137 128   Diastolic 82 82   Pulse 92  95

## 2024-04-26 NOTE — Progress Notes (Signed)
  Chaplain On-Call responded to a page from LPN [name], who reported that the patient is on comfort care support and family is present at the bedside.  Chaplain arrived on the Unit and received update from LPN April. Chaplain met the patient's husband Barbara Wilkins and their daughter. The patient is lying in a curled position in the bed and non-responsive.  Family requested Last Rites from their [employer] faith community. Chaplain left a voice message for Fr. Gayl Katos of [facility], informing him of the need for the Sacrament of the Sick for the patient. Chaplain is awaiting return call at this writing.  Chaplain Dean Every., [facility]

## 2024-04-26 NOTE — Sepsis Progress Note (Signed)
 Notified provider of need to order repeat lactic acid.

## 2024-04-26 NOTE — Progress Notes (Signed)
 CODE SEPSIS - PHARMACY COMMUNICATION  **Broad Spectrum Antibiotics should be administered within 1 hour of Sepsis diagnosis**  Time Code Sepsis Called/Page Received: 1610  Antibiotics Ordered: Cefepime & Vancomycin  Time of 1st antibiotic administration: 0144  Coretta Dexter, PharmD, [facility] 04/26/2024 1:35 AM

## 2024-04-26 NOTE — Progress Notes (Signed)
 Dr Marquette Sites advised that the hospice liaison would see the pt.

## 2024-04-26 NOTE — Progress Notes (Signed)
  Chaplain On-Call made follow-up visit with patient and family.  The family members stated that the Barbara Wilkins has not come in yet to provide the Sacrament of the Sick for the patient.  Chaplain assured family that another attempt will be made to contact the [facility].  Chaplain provided prayer at the family's invitation, and offered spiritual and emotional support.  Chaplain Dean Every., [facility]

## 2024-04-26 NOTE — Progress Notes (Signed)
 This Clinical research associate just spoke with the pt's daughter who was asking me about Hospice services. I advised her there are options available for end of life: remain in the hospital; go to a Hospice facility based upon bed availability; a facility with Hospice services or home with Hospice services. The daughter spoke with her father and they are wanting home with hospice. Of course, it depends on expected prognosis/life expectancy too! She said she understood. I also told her that a Palliative Care order was entered when she was placed on Comfort Measures and that we would see about ordering for Hospice services too! This information was shared with Dr Marquette Sites.

## 2024-04-26 NOTE — H&P (Addendum)
 History and Physical    Patient: Barbara Wilkins WUJ:[id number] DOB: June 22, 1946 DOA: 04/25/2024 DOS: the patient was seen and examined on 04/26/2024 PCP: Rex Castor, MD  Patient coming from: Home  Chief Complaint:  Chief Complaint  Patient presents with   Fever    HPI: Barbara Wilkins is a 78 y.o. female with medical history significant for Advanced mixed dementia, migraines, balance issues and falls, followed by neurologist, Dr. Mason Sole most recently seen  04/24/2024,  pending placement in a memory care unit  in three days being admitted with sepsis secondary to pneumonia after presenting with a congested cough, fever of 101.2 at home and altered mental status.  At baseline, she is normally ambulatory and communicative and has some good days however in the past four days, she started coughing, then in the past two days she has been staying in bed, not conversing as usual and both lethargic and restless. Daughter confirms that she is DNR/DNI but not on hospice (as seen documented in recent outpatient note)  .  Vitals with Tmax 101.6 and pulse 99, O2 sat 98% on room air and BP 115/57.  Labs notable for WBC of 12,200 and lactic acid 1.9.  Respiratory viral panel negative for COVID flu and RSV.  CMP unremarkable, urinalysis sterile.  Troponin 7 and BNP 31.EKG showed NSR at 94 with no ischemic changes CT chest abdomen and pelvis consistent with pneumonia.  Patient started on cefepime and vancomycin and given an LR bolus.  She was also treated with droperidol for agitation in the ED.  Admission requested for sepsis secondary to pneumonia.     No Known Allergies Data Reviewed for HPI: Relevant notes from primary care and specialist visits, past discharge summaries as available in EHR, including Care Everywhere. Prior diagnostic testing as pertinent to current admission diagnoses Updated medications and problem lists for reconciliation ED course, including vitals, labs, imaging, treatment  and response to treatment Triage notes, nursing and pharmacy notes and ED provider's notes Notable results as noted above in HPI   Prior to Admission medications   Medication Sig Start Date End Date Taking? Authorizing Provider  amitriptyline (ELAVIL) 10 MG tablet Take 10 mg by mouth at bedtime. Patient not taking: Reported on 07/23/2020    [provider]  celecoxib (CELEBREX) 100 MG capsule Take 100 mg by mouth 2 (two) times daily.    [provider]  donepezil (ARICEPT) 5 MG tablet Take 5 mg by mouth at bedtime.    [provider]  memantine (NAMENDA) 5 MG tablet Take 5 mg by mouth 2 (two) times daily.    [provider]    Physical Exam: Vitals:   04/25/24 2313 04/25/24 2314 04/26/24 0121  BP: (!) 115/57    Pulse: 99    Resp: 17    Temp: 98.5 F (36.9 C)  (!) 101.6 F (38.7 C)  TempSrc: Axillary  Rectal  SpO2: 98%    Weight:  44.9 kg   Height:  5' 1 (1.549 m)    Physical Exam Vitals and nursing note reviewed.  Constitutional:      General: She is not in acute distress.    Comments: Somnolent but trying to climb out of stretcher.  HENT:     Head: Normocephalic and atraumatic.   Cardiovascular:     Rate and Rhythm: Normal rate and regular rhythm.     Heart sounds: Normal heart sounds.  Pulmonary:     Effort: Pulmonary effort is normal.  Breath sounds: Normal breath sounds.  Abdominal:     Palpations: Abdomen is soft.     Tenderness: There is no abdominal tenderness.   Neurological:     General: No focal deficit present.     Mental Status: She is lethargic.     Comments: Restless, somnolent    Labs on Admission: I have personally reviewed following labs and imaging studies  CBC: Recent Labs  Lab 04/26/24 0049  WBC 12.2*  NEUTROABS 9.7*  HGB 12.1  HCT 37.8  MCV 90.6  PLT 241   Basic Metabolic Panel: Recent Labs  Lab 04/26/24 0049  NA 139  K 3.6  CL 107  CO2 23  GLUCOSE 104*  BUN 22  CREATININE 0.64   CALCIUM 8.8*   GFR: Estimated Creatinine Clearance: 41.1 mL/min (by C-G formula based on SCr of 0.64 mg/dL). Liver Function Tests: Recent Labs  Lab 04/26/24 0049  AST 21  ALT 12  ALKPHOS 75  BILITOT 1.0  PROT 7.0  ALBUMIN 3.5   No results for input(s): LIPASE, AMYLASE in the last 168 hours. No results for input(s): AMMONIA in the last 168 hours. Coagulation Profile: No results for input(s): INR, PROTIME in the last 168 hours. Cardiac Enzymes: No results for input(s): CKTOTAL, CKMB, CKMBINDEX, TROPONINI in the last 168 hours. BNP (last 3 results) No results for input(s): PROBNP in the last 8760 hours. HbA1C: No results for input(s): HGBA1C in the last 72 hours. CBG: No results for input(s): GLUCAP in the last 168 hours. Lipid Profile: No results for input(s): CHOL, HDL, LDLCALC, TRIG, CHOLHDL, LDLDIRECT in the last 72 hours. Thyroid Function Tests: No results for input(s): TSH, T4TOTAL, FREET4, T3FREE, THYROIDAB in the last 72 hours. Anemia Panel: No results for input(s): VITAMINB12, FOLATE, FERRITIN, TIBC, IRON, RETICCTPCT in the last 72 hours. Urine analysis:    Component Value Date/Time   COLORURINE YELLOW (A) 04/26/2024 0049   APPEARANCEUR CLEAR (A) 04/26/2024 0049   LABSPEC 1.023 04/26/2024 0049   PHURINE 7.0 04/26/2024 0049   GLUCOSEU NEGATIVE 04/26/2024 0049   HGBUR NEGATIVE 04/26/2024 0049   BILIRUBINUR NEGATIVE 04/26/2024 0049   KETONESUR NEGATIVE 04/26/2024 0049   PROTEINUR NEGATIVE 04/26/2024 0049   NITRITE NEGATIVE 04/26/2024 0049   LEUKOCYTESUR NEGATIVE 04/26/2024 0049    Radiological Exams on Admission: CT CHEST ABDOMEN PELVIS W CONTRAST Result Date: 04/26/2024 EXAM: CT CHEST, ABDOMEN AND PELVIS WITH CONTRAST 04/26/2024 02:14:06 AM TECHNIQUE: CT of the chest, abdomen and pelvis was performed with the administration of intravenous contrast. Multiplanar reformatted images are provided for review.  Automated exposure control, iterative reconstruction, and/or weight based adjustment of the mA/kV was utilized to reduce the radiation dose to as low as reasonably achievable. COMPARISON: CT abdomen / pelvis dated 08/21/2022. CLINICAL HISTORY: Sepsis; fever, altered mental status, leukocytosis; thick cough, non-diagnostic CXR. Per ed notes; Pt. Brought in via POV by daughter and husband for high fever at home, reports 101.2 axillary temp at 2230, daughter states decreased LOC, immobile today, won't talk today, would not respond to sternal rub by daughter who is CRNA, also a wet cough. FINDINGS: CHEST: MEDIASTINUM: Mild coronary atherosclerosis of the LAD. The central airways are clear. THORACIC LYMPH NODES: No mediastinal, hilar or axillary lymphadenopathy. LUNGS AND PLEURA: Mild patchy right basilar opacity, favoring mild infection/pneumonia over atelectasis. No pleural effusion or pneumothorax. ABDOMEN AND PELVIS: LIVER: Subcentimeter hepatic cysts. GALLBLADDER AND BILE DUCTS: Gallbladder is unremarkable. No biliary ductal dilatation. SPLEEN: No acute abnormality. PANCREAS: No acute abnormality. ADRENAL GLANDS: No  acute abnormality. KIDNEYS, URETERS AND BLADDER: 3.7 cm simple right lower pole renal cyst (image 71), motion degraded, but benign (Bosniak 1), no follow-up is recommended. No stones in the kidneys or ureters. No hydronephrosis. No perinephric or periureteral stranding. Urinary bladder is unremarkable. GI AND BOWEL: Sigmoid diverticulosis, without evidence of diverticulitis. The appendix is not discretely visualized. Stomach demonstrates no acute abnormality. There is no bowel obstruction. REPRODUCTIVE ORGANS: No acute abnormality. PERITONEUM AND RETROPERITONEUM: No ascites. No free air. VASCULATURE: Mild thoracic aortic atherosclerosis. Atherosclerotic calcifications of the abdominal aorta and branch vessels, although patent. ABDOMINAL AND PELVIS LYMPH NODES: No lymphadenopathy. BONES AND SOFT TISSUES:  Severe compression fracture deformity at T12 (sagittal image 54), chronic. Severe compression fracture deformity at T3 (sagittal image 49), likely chronic, not previously imaged. Mild degenerative changes of the visualized thoracolumbar spine, most prominent at L5-S1. No focal soft tissue abnormality. LIMITATIONS/ARTIFACTS: Motion degraded images. IMPRESSION: 1. Mild patchy right basilar opacity, favoring mild infection/pneumonia over atelectasis. 2. Sigmoid diverticulosis, without evidence of diverticulitis. Electronically signed by: Zadie Herter MD 04/26/2024 02:25 AM EDT RP Workstation: [id number]   DG Chest Port 1 View Result Date: 04/25/2024 CLINICAL DATA:  Fever with decreased mentation and mobility. EXAM: PORTABLE CHEST 1 VIEW COMPARISON:  Portable chest 11/22/2022 FINDINGS: There is increased elevation of the right hemidiaphragm obscuring the right base. The visualized lungs are clear. The cardiomediastinal silhouette and vasculature are normal. Osteopenia. IMPRESSION: Increased elevation of the right hemidiaphragm obscuring the right base. No evidence of acute chest disease. Electronically Signed   By: Denman Fischer M.D.   On: 04/25/2024 23:36        Assessment and Plan: * Sepsis due to pneumonia (HCC) Sepsis criteria include fever, tachycardia, leukocytosis, acute encephalopathy Continue sepsis fluids Rocephin and azithromycin Antitussives, DuoNebs as needed Supplemental oxygen if needed Aspiration precautions Will keep n.p.o. until more alert-daughter at bedside who is a retired Scientist, clinical (histocompatibility and immunogenetics) does not want patient evaluated by speech therapy Follow cultures  Acute metabolic encephalopathy Advanced mixed dementia with behavioral disturbance Secondary to sepsis At baseline, she is normally ambulatory and communicative and has some good days however on the day of arrival, she was staying in bed, not talking and was very restless  Delirium precautions On seroquel and gabapentin-holding  until more certain that is safe to swallow Haldol-Ativan as needed: Patient very restless on physical exam and is a high fall risk Neurologic checks Fall and aspiration precautions Patient is scheduled to be admitted to a memory care unit in 3 days-family does not want TOC involved  Migraines On Tylenol only, per husband at bedside    Frequent falls Patient has had several falls, with only minor bruising per husband at bedside.  Last fall was a few days ago.  Constipation Takes dulcolax nightly     DVT prophylaxis: Lovenox  Consults: none  Advance Care Planning: DNR/DNI  Family Communication: husband and daughter (retired Scientist, clinical (histocompatibility and immunogenetics) )at bedside  -Do not want involvement of Child psychotherapist or speech therapist  -They would like her to eat and are aware it can increase her risk of aspiration.They stated that they only want her here to have her pneumonia treated and they will take her home and take care of taking her to the memory care center.   Disposition Plan: Back to previous home environment  Severity of Illness: The appropriate patient status for this patient is INPATIENT. Inpatient status is judged to be reasonable and necessary in order to provide the required intensity of service to ensure the  patient's safety. The patient's presenting symptoms, physical exam findings, and initial radiographic and laboratory data in the context of their chronic comorbidities is felt to place them at high risk for further clinical deterioration. Furthermore, it is not anticipated that the patient will be medically stable for discharge from the hospital within 2 midnights of admission.   * I certify that at the point of admission it is my clinical judgment that the patient will require inpatient hospital care spanning beyond 2 midnights from the point of admission due to high intensity of service, high risk for further deterioration and high frequency of surveillance required.*  Author: Lanetta Pion, MD 04/26/2024 2:57 AM  For on call review [email or web address].

## 2024-04-26 NOTE — Progress Notes (Signed)
 PHARMACIST - PHYSICIAN COMMUNICATION  CONCERNING:  Enoxaparin (Lovenox) for DVT Prophylaxis    RECOMMENDATION: Patient was prescribed enoxaprin 40mg  q24 hours for VTE prophylaxis.   Filed Weights   04/25/24 2314  Weight: 44.9 kg (99 lb)    Body mass index is 18.71 kg/m.  Estimated Creatinine Clearance: 41.1 mL/min (by C-G formula based on SCr of 0.64 mg/dL).  Patient is candidate for enoxaparin 30mg  every 24 hours based on CrCl <1ml/min or Weight <45kg  DESCRIPTION: Pharmacy has adjusted enoxaparin dose per [facility] policy.  Patient is now receiving enoxaparin 30 mg every 24 hours   Coretta Dexter, PharmD, [facility] 04/26/2024 3:03 AM

## 2024-04-26 NOTE — Assessment & Plan Note (Addendum)
 On Tylenol only, per husband at bedside

## 2024-04-26 NOTE — Assessment & Plan Note (Addendum)
 Patient has had several falls, with only minor bruising per husband at bedside.  Last fall was a few days ago.

## 2024-04-26 NOTE — Progress Notes (Signed)
 SLP Cancellation Note  Patient Details Name: Barbara Wilkins MRN: [id number] DOB: 03/08/1946   Cancelled treatment:       Reason Eval/Treat Not Completed:  (chart reviewed; consulted w/ Family in room w/ pt)  Met w/ Family and pt in room. Family immediately shared that they have requested for pt to transition to Comfort Care status.Offered support to [name] and few ways to offer comfort to pt, such as oral care/moistening, warm washcloth.  NSG aware and stated MD and Chaplain have been informed. ST services will sign off at this time. MD to reconsult if any new needs while admitted.     Darla Edward, MS, CCC-SLP Speech Language Pathologist Rehab Services; [facility] [phone] (ascom) Alyah Boehning 04/26/2024, 10:00 AM

## 2024-04-26 NOTE — Plan of Care (Signed)
  Problem: Clinical Measurements: Goal: Ability to maintain clinical measurements within normal limits will improve Outcome: Progressing   Problem: Clinical Measurements: Goal: Respiratory complications will improve Outcome: Progressing   Problem: Clinical Measurements: Goal: Cardiovascular complication will be avoided Outcome: Progressing   Problem: Pain Managment: Goal: General experience of comfort will improve and/or be controlled Outcome: Progressing   Problem: Safety: Goal: Ability to remain free from injury will improve Outcome: Progressing

## 2024-04-26 NOTE — Progress Notes (Signed)
  Chaplain On-Call met patient's daughter Stana Ear and friend Siegfried Dress in the [employer]  They reported that Fr. Gayl Katos had come to provide the Sacrament of the Sick for the patient.  Chaplain offered support and encouragement.  Chaplain Dean Every., [facility]

## 2024-04-26 NOTE — Progress Notes (Signed)
  Chaplain On-Call left another voice message for Fr. Gayl Katos of [facility] with the family's request for the Sacrament of the Sick and Anointing for the patient.  Previous messages were at: [phone]  Chaplain Charlie Jones Ness., [facility]

## 2024-04-26 NOTE — Assessment & Plan Note (Addendum)
 Advanced mixed dementia with behavioral disturbance Secondary to sepsis At baseline, she is normally ambulatory and communicative and has some good days however on the day of arrival, she was staying in bed, not talking and was very restless  Delirium precautions On seroquel and gabapentin-holding until more certain that is safe to swallow Haldol-Ativan as needed: Patient very restless on physical exam and is a high fall risk Neurologic checks Fall and aspiration precautions Patient is scheduled to be admitted to a memory care unit in 3 days-family does not want TOC involved

## 2024-04-26 NOTE — Progress Notes (Signed)
 After receiving the order to d/c LR IVF, noted that upon entering the patient's room the IV pump was turned off and the IVF were no longer infusing.  Verbalized that the pump was already off but none of the family in the room said anything.  Continued with unhooking the line and flushing the IV.

## 2024-04-26 NOTE — Plan of Care (Signed)
  Problem: Respiratory: Goal: Ability to maintain adequate ventilation will improve Outcome: Progressing   Problem: Coping: Goal: Level of anxiety will decrease Outcome: Progressing   Problem: Pain Managment: Goal: General experience of comfort will improve and/or be controlled Outcome: Progressing   Problem: Safety: Goal: Ability to remain free from injury will improve Outcome: Progressing

## 2024-04-26 NOTE — Assessment & Plan Note (Signed)
 Takes dulcolax nightly

## 2024-04-26 NOTE — ED Provider Notes (Signed)
 [facility] Provider Note    Event Date/Time   First MD Initiated Contact with Patient 04/25/24 2318     (approximate)   History   Fever  Level 5 caveat:  history/ROS limited by acute/critical illness  HPI Barbara Wilkins is a 78 y.o. female with history of Lewy body dementia.  Her family is with her at bedside and she presents tonight for evaluation of fever and altered mental status.  They said that normally, despite her dementia, she is ambulatory and conversant and has really good days.  However all day today she has been essentially curled up on her side, moaning and noncommunicative.  She has been combative recently as well and left some scratches on her husband which is very atypical for her.  They discovered at home that she had a fever of more than 100 degrees.  She has had a slight wet cough as well recently, and a couple of other family members have had respiratory viruses recently.  She has not been giving any indication of abdominal pain and her urine has not been particularly foul-smelling.  She has not had anything to eat or drink today and she has not vomited.     Physical Exam   Triage Vital Signs: ED Triage Vitals  Encounter Vitals Group     BP 04/25/24 2313 (!) 115/57     Girls Systolic BP Percentile --      Girls Diastolic BP Percentile --      Boys Systolic BP Percentile --      Boys Diastolic BP Percentile --      Pulse Rate 04/25/24 2313 99     Resp 04/25/24 2313 17     Temp 04/25/24 2313 98.5 F (36.9 C)     Temp Source 04/25/24 2313 Axillary     SpO2 04/25/24 2313 98 %     Weight 04/25/24 2314 44.9 kg (99 lb)     Height 04/25/24 2314 1.549 m (5' 1)     Head Circumference --      Peak Flow --      Pain Score --      Pain Loc --      Pain Education --      Exclude from Growth Chart --     Most recent vital signs: Vitals:   04/25/24 2313 04/26/24 0121  BP: (!) 115/57   Pulse: 99   Resp: 17   Temp: 98.5 F (36.9  C) (!) 101.6 F (38.7 C)  SpO2: 98%     General: Awake but will only moan and not otherwise respond.  Ill-appearing. CV:  Good peripheral perfusion.  Regular rate and rhythm, borderline tachycardia. Resp:  Normal effort.  No tachypnea.  Lungs clear to auscultation bilaterally. Abd:  No distention.  No tenderness to palpation nor reaction to palpation. Other:  Patient is protecting her airway but is relatively minimally responsive.  Will respond a little bit more to family but is nonverbal at this time.   ED Results / Procedures / Treatments   Labs (all labs ordered are listed, but only abnormal results are displayed) Labs Reviewed  COMPREHENSIVE METABOLIC PANEL WITH GFR - Abnormal; Notable for the following components:      Result Value   Glucose, Bld 104 (*)    Calcium 8.8 (*)    All other components within normal limits  CBC WITH DIFFERENTIAL/PLATELET - Abnormal; Notable for the following components:   WBC 12.2 (*)  Neutro Abs 9.7 (*)    All other components within normal limits  URINALYSIS, W/ REFLEX TO CULTURE (INFECTION SUSPECTED) - Abnormal; Notable for the following components:   Color, Urine YELLOW (*)    APPearance CLEAR (*)    All other components within normal limits  RESP PANEL BY RT-PCR (RSV, FLU A&B, COVID)  RVPGX2  CULTURE, BLOOD (ROUTINE X 2)  CULTURE, BLOOD (ROUTINE X 2)  LACTIC ACID, PLASMA  BRAIN NATRIURETIC PEPTIDE  LACTIC ACID, PLASMA  TROPONIN I (HIGH SENSITIVITY)  TROPONIN I (HIGH SENSITIVITY)     EKG  ED ECG REPORT I, Lynnda Sas, the attending physician, personally viewed and interpreted this ECG.  Date: 04/25/2024 EKG Time: 23: 15 Rate: 94 Rhythm: normal sinus rhythm QRS Axis: normal Intervals: normal ST/T Wave abnormalities: Non-specific ST segment / T-wave changes, but no clear evidence of acute ischemia. Narrative Interpretation: no definitive evidence of acute ischemia; does not meet STEMI criteria.    RADIOLOGY See ED course  for details   PROCEDURES:  Critical Care performed: Yes, see critical care procedure note(s)  .1-3 Lead EKG Interpretation  Performed by: Lynnda Sas, MD Authorized by: Lynnda Sas, MD     Interpretation: normal     ECG rate:  99   ECG rate assessment: normal     Rhythm: sinus rhythm     Ectopy: none     Conduction: normal   .Critical Care  Performed by: Lynnda Sas, MD Authorized by: Lynnda Sas, MD   Critical care provider statement:    Critical care time (minutes):  45   Critical care time was exclusive of:  Separately billable procedures and treating other patients   Critical care was necessary to treat or prevent imminent or life-threatening deterioration of the following conditions:  Sepsis   Critical care was time spent personally by me on the following activities:  Development of treatment plan with patient or surrogate, evaluation of patient's response to treatment, examination of patient, obtaining history from patient or surrogate, ordering and performing treatments and interventions, ordering and review of laboratory studies, ordering and review of radiographic studies, pulse oximetry, re-evaluation of patient's condition and review of old charts     IMPRESSION / MDM / ASSESSMENT AND PLAN / ED COURSE  I reviewed the triage vital signs and the nursing notes.                              Differential diagnosis includes, but is not limited to, sepsis, pneumonia, urinary tract infection, intra-abdominal infection, renal dysfunction, electrolyte or metabolic abnormality, less likely intracranial hemorrhage or meningitis.  Patient's presentation is most consistent with acute presentation with potential threat to life or bodily function.  Labs/studies ordered: I ordered standard sepsis labs/studies including the following: respiratory viral panel PCR swab, blood cultures x2, pro time-INR, CMP, urinalysis, urine culture, lactic acid, CBC with differential,  high-sensitivity troponin, lipase, 1-view chest x-ray, EKG.  Interventions/Medications given:  Medications  vancomycin (VANCOCIN) IVPB 1000 mg/200 mL premix (1,000 mg Intravenous New Bag/Given 04/26/24 0230)  lactated ringers infusion (has no administration in time range)  enoxaparin (LOVENOX) injection 30 mg (has no administration in time range)  cefTRIAXone (ROCEPHIN) 2 g in sodium chloride  0.9 % 100 mL IVPB (has no administration in time range)  azithromycin (ZITHROMAX) 500 mg in sodium chloride  0.9 % 250 mL IVPB (has no administration in time range)  acetaminophen (TYLENOL) tablet 650 mg (has no administration in time  range)    Or  acetaminophen (TYLENOL) suppository 650 mg (has no administration in time range)  ondansetron (ZOFRAN) tablet 4 mg (has no administration in time range)    Or  ondansetron (ZOFRAN) injection 4 mg (has no administration in time range)  albuterol (PROVENTIL) (2.5 MG/3ML) 0.083% nebulizer solution 2.5 mg (has no administration in time range)  guaiFENesin-dextromethorphan (ROBITUSSIN DM) 100-10 MG/5ML syrup 10 mL (has no administration in time range)  haloperidol lactate (HALDOL) injection 1 mg (has no administration in time range)  lactated ringers bolus 1,000 mL (0 mLs Intravenous Stopped 04/26/24 0231)  droperidol (INAPSINE) 2.5 MG/ML injection 1.25 mg (1.25 mg Intravenous Given 04/26/24 0137)  lactated ringers bolus 500 mL (500 mLs Intravenous New Bag/Given 04/26/24 0235)  ceFEPIme (MAXIPIME) 2 g in sodium chloride  0.9 % 100 mL IVPB (0 g Intravenous Stopped 04/26/24 0223)  acetaminophen (TYLENOL) suppository 650 mg (650 mg Rectal Given 04/26/24 0144)  iohexol  (OMNIPAQUE ) 300 MG/ML solution 80 mL (80 mLs Intravenous Contrast Given 04/26/24 0201)  droperidol (INAPSINE) 2.5 MG/ML injection 1.25 mg (1.25 mg Intravenous Given 04/26/24 0234)    (Note:  hospital course my include additional interventions and/or labs/studies not listed above.)   I initiated a workup at  first anticipating sepsis but it could have been a viral infection.  Once she had a leukocytosis of 12.2 and a verified rectal temperature of 1 1.6 degrees, I made her code sepsis and ordered 30 mL/kg of LR fluid bolus and broad-spectrum antibiotic coverage with cefepime 2 g IV and vancomycin per pharmacy consult.  Additional lab work revealed no other specific abnormalities including no evidence of UTI.  Lactic acid is within normal limits and thus she does not qualify as severe sepsis nor septic shock.  I independently viewed and interpreted her chest x-ray and it was difficult to appreciate likely due to partially to her cooperation, but given the respiratory symptoms and difficulty getting history and ruling out other internal issues such as subtle pneumonia or intra-abdominal infection or even infected ureteral stone, I will proceed with CT of the chest/abdomen/pelvis to further evaluate for possible sources of sepsis.  Family is in agreement with the plan for admission to the hospitalist service which is very appropriate given her current condition.  The patient is on the cardiac monitor to evaluate for evidence of arrhythmia and/or significant heart rate changes.   Clinical Course as of 04/26/24 3086  Sat Apr 26, 2024  0228 CT CHEST ABDOMEN PELVIS W CONTRAST I viewed and interpreted the patient's chest/abdomen/pelvis CT and I suspect that she has some pneumonia in her right lung.  Radiologist agrees with this finding.  No evidence of intra-abdominal infection such as diverticulitis. [CF]  0228 I am consulting the hospitalist team for admission.  Family updated as well [CF]  [phone] I consulted by phone with the admitting hospitalist, and they will admit the patient -Dr. Vallarie Gauze [CF]  [phone] Lactic Acid, Venous(!!): 2.6 Patient is admitted, but her lactic acid has gone up.  Hospitalist aware. [CF]    Clinical Course User Index [CF] Lynnda Sas, MD     FINAL CLINICAL IMPRESSION(S) / ED  DIAGNOSES   Final diagnoses:  Pneumonia of right lower lobe due to infectious organism  Sepsis, due to unspecified organism, unspecified whether acute organ dysfunction present (HCC)  Altered mental status, unspecified altered mental status type  Lewy body dementia with agitation, unspecified dementia severity (HCC)     Rx / DC Orders   ED Discharge Orders  None        Note:  This document was prepared using Dragon voice recognition software and may include unintentional dictation errors.   Lynnda Sas, MD 04/26/24 [phone]

## 2024-04-27 ENCOUNTER — Encounter: Payer: Self-pay | Admitting: Internal Medicine

## 2024-04-27 DIAGNOSIS — Z7189 Other specified counseling: Secondary | ICD-10-CM | POA: Diagnosis not present

## 2024-04-27 DIAGNOSIS — Z66 Do not resuscitate: Secondary | ICD-10-CM

## 2024-04-27 DIAGNOSIS — Z515 Encounter for palliative care: Secondary | ICD-10-CM | POA: Diagnosis not present

## 2024-04-27 DIAGNOSIS — R4182 Altered mental status, unspecified: Secondary | ICD-10-CM

## 2024-04-27 DIAGNOSIS — F02811 Dementia in other diseases classified elsewhere, unspecified severity, with agitation: Secondary | ICD-10-CM

## 2024-04-27 DIAGNOSIS — A419 Sepsis, unspecified organism: Secondary | ICD-10-CM | POA: Diagnosis not present

## 2024-04-27 DIAGNOSIS — G3183 Dementia with Lewy bodies: Secondary | ICD-10-CM

## 2024-04-27 DIAGNOSIS — J189 Pneumonia, unspecified organism: Secondary | ICD-10-CM | POA: Diagnosis not present

## 2024-04-27 DIAGNOSIS — Z789 Other specified health status: Secondary | ICD-10-CM

## 2024-04-27 MED ORDER — LORAZEPAM 2 MG/ML IJ SOLN
1.0000 mg | INTRAMUSCULAR | Status: DC | PRN
  Administered 2024-04-27 (×3): 1 mg via INTRAVENOUS
  Filled 2024-04-27 (×3): qty 1

## 2024-04-27 MED ORDER — MORPHINE SULFATE (PF) 2 MG/ML IV SOLN
2.0000 mg | INTRAVENOUS | Status: DC | PRN
  Administered 2024-04-27 (×2): 2 mg via INTRAVENOUS
  Filled 2024-04-27 (×2): qty 1

## 2024-04-27 NOTE — Consult Note (Signed)
 Consultation Note Date: 04/27/2024 at 10:00  Patient Name: Barbara Wilkins  DOB: 1946/09/28  MRN: [id number]  Age / Sex: 78 y.o., female  PCP: Rex Castor, MD Referring Physician: Roise Cleaver, DO  HPI/Patient Profile: 78 y.o. female  with past medical history significant for Lewy body dementia, migraines, balance issues and frequent falls followed by neurology.  Patient presented to ED 04/25/2024 for evaluation of fever and altered mental status.  Barbara Wilkins reported at that time patient was ambulatory and able to have conversations despite her dementia.  She had sudden onset of being nonverbal, moaning and lying curled up on her side.  Recently she has become combative with family members.  Family reports she did have a fever greater than 100 degrees as well as a wet cough but report family members have had respiratory viruses recently.  ED workup showed WBC of 12.2 and LA of 1.9, troponin 7 and BNP 31,COVID/flu/RSV negative.  CMP unremarkable.  UA negative.  CTAP was consistent with pneumonia.    She was admitted for management of sepsis secondary to pneumonia and acute metabolic encephalopathy.  PMT was consulted for goals of care discussion.  Clinical Assessment and Goals of Care: Extensive chart review completed prior to meeting patient including labs, vital signs, imaging, progress notes, orders, and available advanced directive documents from current and previous encounters. I then met with family at the bedside to discuss diagnosis prognosis, GOC, EOL wishes, disposition and options.  I introduced Palliative Medicine as specialized medical care for people living with serious illness. It focuses on providing relief from the symptoms and stress of a serious illness. The goal is to improve quality of life for both the patient and the family.  Elderly, ill-appearing female resting in bed.  She does  not respond to verbal or tactile stimuli.  During visit patient became agitated attempting to slide between bed rails and was assisted back into bed.  Agitation increased with patient attempting to lean over bed rails requiring family to restrain to prevent fall.  Medication for agitation ordered.  Met at the bedside with Barbara Wilkins (daughter), Barbara Wilkins (husband), son, grandson and family friend for goals of care discussion.  We discussed a brief life review of the patient.  Barbara Wilkins tearfully shares that he and Barbara Wilkins have been married approximately 62 years.  They have 2 children, 4 grandchildren and 1 great grandchild.  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  As far as functional and nutritional status Barbara Wilkins required assistance with all ADLs at home and was scheduled to transfer to a memory care unit this week.  Barbara Wilkins states her mother has suffered from frequent falls lately and increasing loss of appetite.  She is able to take small steps but needs to stand by assist with walking.  We discussed patient's current illness and what it means in the larger context of patient's on-going co-morbidities.  Natural disease trajectory and expectations at EOL were discussed.  I attempted to elicit values and goals of care  important to the patient.  The most important goal for family at this time is to take their mother home with hospice care.  The difference between aggressive medical intervention and comfort care was considered in light of the patient's goals of care.   Advance directives, concepts specific to code status, artificial feeding and hydration, and rehospitalization were considered and discussed.  Confirmed DNR/DNI status.  Family does not want IV fluids or other medical interventions and have transitioned to comfort care with the assistance of TRH.  Education offered regarding concept specific to human mortality and the limitations of medical interventions to prolong life  when the body begins to fail to thrive.  Family is facing treatment option decisions, advanced directive, and anticipatory care needs.    Discussed with patient/family the importance of continued conversation with family and the medical providers regarding overall plan of care and treatment options, ensuring decisions are within the context of the patient's values and GOCs.    Hospice and Palliative Care services outpatient were explained and offered.  Family understands and wants patient to transfer home under hospice care even without DME stating they can wait for delivery next week.  TOC and hospice liaison notified of family wishing to go home with hospice.  Questions and concerns were addressed. The family was encouraged to call with questions or concerns.   Primary Decision Maker NEXT OF KIN Barbara Wilkins (spouse)  Physical Exam Vitals reviewed.  Constitutional:      General: She is not in acute distress.    Appearance: She is ill-appearing.     Comments: Frail  HENT:     Head: Normocephalic and atraumatic.     Mouth/Throat:     Mouth: Mucous membranes are dry.  Pulmonary:     Effort: Pulmonary effort is normal. No respiratory distress.   Musculoskeletal:     Right lower leg: No edema.     Left lower leg: No edema.   Skin:    General: Skin is warm and dry.   Psychiatric:     Comments: Agitation   Recommendations/Plan:         Focus of care is comfort and dignity allowing for natural death Utilize ordered medications to maintain comfort Home with hospice  Palliative Assessment/Data: 20-30%    Discussed plan of care with Dr. Jerold Moor, RN, TOC and hospice liaison.  Thank you for this consult. Palliative medicine will continue to follow and assist holistically.   Time Total: 75 minutes  Time spent includes: Detailed review of medical records (labs, imaging, vital signs), medically appropriate exam (mental status, respiratory, cardiac, skin), discussed with  treatment team, counseling and educating patient, family and staff, documenting clinical information, medication management and coordination of care.     Barbara Wilkins, Barbara Wilkins- [facility] Palliative Medicine Team  04/27/2024 9:12 AM  Office [phone]  Pager [phone]     Please contact Palliative Medicine Team providers via AMION for questions and concerns.

## 2024-04-27 NOTE — Progress Notes (Signed)
 MD order received in [facility] to discharge pt to the Hospice Home/[facility] today; see Girard Lam RN's notes regarding Hospice referral; [facility] called to get a patient report on the pt, report given; no further questions voiced at this time; advised that EMS has been set up for 2030 transport tonight; [facility] representative asked that the pt be medicated prior to transport

## 2024-04-27 NOTE — TOC Progression Note (Signed)
 Transition of Care [facility]) - Progression Note    Patient Details  Name: Barbara Wilkins MRN: [id number] Date of Birth: May 09, 1946  Transition of Care [facility]) CM/SW Contact  Marino Sias, RN Phone Number: 04/27/2024, 8:58 AM  Clinical Narrative:  Spoke with Husband Barbara Wilkins about Hospice Agency choice and he prefers [facility]. Kara notified.         Expected Discharge Plan and Services                                               Social Determinants of Health (SDOH) Interventions SDOH Screenings   Food Insecurity: No Food Insecurity (12/04/2023)   Received from [facility]  Housing: Unknown (12/19/2023)   Received from [facility]  Transportation Needs: No Transportation Needs (12/04/2023)   Received from [facility]  Utilities: Not At Risk (12/04/2023)   Received from [facility]  Financial Resource Strain: Low Risk  (12/04/2023)   Received from [facility]  Social Connections: Unknown (03/24/2022)   Received from [facility]  Tobacco Use: Unknown (04/25/2024)  Recent Concern: Tobacco Use - Medium Risk (04/25/2024)   Received from [facility]    Readmission Risk Interventions     No data to display

## 2024-04-27 NOTE — Progress Notes (Addendum)
 PROGRESS NOTE    Barbara Wilkins  ZOX:[id number] DOB: June 18, 1946 DOA: 04/25/2024 PCP: Rex Castor, MD  Chief Complaint  Patient presents with   Fever    Hospital Course:  Barbara Wilkins is a 78 year old female with advanced mixed dementia, who was admitted by my colleague earlier this morning for community-acquired pneumonia.  Patient was initially started on broad-spectrum antibiotics and admitted to the floor for IV fluids, antibiotic therapy, and telemetry monitoring. Patient's family then elected for hospice care. On 6/14 she was made comfort measures only.  We initially plan to send patient home with home hospice on 6/15 but after further consideration family would like to pursue inpatient hospice facility. Subjective: Difficulty with pain control overnight.  We have increased morphine. On evaluation today patient is lying in the fetal position and resting easily.  Her husband and daughter are at bedside.  They are requesting for discharge home today.   Objective: Vitals:   04/26/24 0319 04/26/24 0335 04/26/24 0430 04/26/24 0507  BP: 128/82  137/82   Pulse:   92   Resp:      Temp:  98.5 F (36.9 C) 98.3 F (36.8 C) 99.4 F (37.4 C)  TempSrc:  Oral Axillary   SpO2:   99%   Weight:      Height:       No intake or output data in the 24 hours ending 04/27/24 1649 Filed Weights   04/25/24 2314  Weight: 44.9 kg    Examination: General exam: Chronically ill-appearing, frail, lying in the fetal position.  Sleeping easily.  Does not interact for exam Respiratory system: No work of breathing, symmetric chest wall expansion Cardiovascular system: S1 & S2 heard, RRR.  Skin: No rashes, lesions Psychiatry: Calm, sleeping easily at this time.  Difficult to assess otherwise  Assessment & Plan:  Principal Problem:   Sepsis due to pneumonia [facility]) Active Problems:   Acute metabolic encephalopathy   Migraines   Dementia with behavioral disturbance (HCC)    Constipation   Frequent falls   Hospice care patient    Comfort Measures only - Discontinue all medications that do not provide immediate comfort.  Discontinue all lab draws.  Turn off alarms.  Q shift vitals only. - Palliative care and hospice consulted. - Continue symptomatic support.  Have increased morphine today due to increasing needs.  If she continues to require higher levels of morphine we will initiate a drip. - Planning for inpatient hospice, may discharge tomorrow  Community-acquired pneumonia - No antibiotics per family request.  Comfort measures only as above  Advanced mixed dementia Migraines Constipation Frequent falls History of cataract 2018 GERD - CMO as above -Family at bedside. Discontinue all medications that do not immediately provide the patient comfort   DVT prophylaxis: n/a   Code Status: Do not attempt resuscitation (DNR) - Comfort care Disposition:  Inpatient currently, pending GIP placement  Consultants:    Procedures:    Antimicrobials:  Anti-infectives (From admission, onward)    Start     Dose/Rate Route Frequency Ordered Stop   04/26/24 1400  cefTRIAXone (ROCEPHIN) 2 g in sodium chloride  0.9 % 100 mL IVPB  Status:  Discontinued        2 g 200 mL/hr over 30 Minutes Intravenous Every 24 hours 04/26/24 0301 04/26/24 1045   04/26/24 0800  azithromycin (ZITHROMAX) 500 mg in sodium chloride  0.9 % 250 mL IVPB  Status:  Discontinued        500 mg 250 mL/hr over  60 Minutes Intravenous Every 24 hours 04/26/24 0301 04/26/24 1045   04/26/24 0145  ceFEPIme (MAXIPIME) 2 g in sodium chloride  0.9 % 100 mL IVPB        2 g 200 mL/hr over 30 Minutes Intravenous  Once 04/26/24 0133 04/26/24 0223   04/26/24 0145  vancomycin (VANCOCIN) IVPB 1000 mg/200 mL premix        1,000 mg 200 mL/hr over 60 Minutes Intravenous  Once 04/26/24 0133 04/26/24 0409       Data Reviewed: I have personally reviewed following labs and imaging studies CBC: Recent Labs   Lab 04/26/24 0049  WBC 12.2*  NEUTROABS 9.7*  HGB 12.1  HCT 37.8  MCV 90.6  PLT 241   Basic Metabolic Panel: Recent Labs  Lab 04/26/24 0049  NA 139  K 3.6  CL 107  CO2 23  GLUCOSE 104*  BUN 22  CREATININE 0.64  CALCIUM 8.8*   GFR: Estimated Creatinine Clearance: 41.1 mL/min (by C-G formula based on SCr of 0.64 mg/dL). Liver Function Tests: Recent Labs  Lab 04/26/24 0049  AST 21  ALT 12  ALKPHOS 75  BILITOT 1.0  PROT 7.0  ALBUMIN 3.5   CBG: No results for input(s): GLUCAP in the last 168 hours.  Recent Results (from the past 240 hours)  Blood Culture (routine x 2)     Status: None (Preliminary result)   Collection Time: 04/26/24 12:48 AM   Specimen: BLOOD  Result Value Ref Range Status   Specimen Description BLOOD BLOOD RIGHT ARM  Final   Special Requests   Final    BOTTLES DRAWN AEROBIC AND ANAEROBIC Blood Culture adequate volume   Culture   Final    NO GROWTH 1 DAY Performed at [facility], [address] [place], [place] [address]    Report Status PENDING  Incomplete  Blood Culture (routine x 2)     Status: None (Preliminary result)   Collection Time: 04/26/24 12:48 AM   Specimen: BLOOD  Result Value Ref Range Status   Specimen Description BLOOD BLOOD LEFT ARM  Final   Special Requests   Final    BOTTLES DRAWN AEROBIC AND ANAEROBIC Blood Culture results may not be optimal due to an inadequate volume of blood received in culture bottles   Culture   Final    NO GROWTH 1 DAY Performed at [facility], [address] [place], [place] [address]    Report Status PENDING  Incomplete  Resp panel by RT-PCR (RSV, Flu A&B, Covid) Urine, Clean Catch     Status: None   Collection Time: 04/26/24 12:49 AM   Specimen: Urine, Clean Catch; Nasal Swab  Result Value Ref Range Status   SARS Coronavirus 2 by RT PCR NEGATIVE NEGATIVE Final    Comment: (NOTE) SARS-CoV-2 target nucleic acids are NOT DETECTED.  The SARS-CoV-2 RNA is generally  detectable in upper respiratory specimens during the acute phase of infection. The lowest concentration of SARS-CoV-2 viral copies this assay can detect is 138 copies/mL. A negative result does not preclude SARS-Cov-2 infection and should not be used as the sole basis for treatment or other patient management decisions. A negative result may occur with  improper specimen collection/handling, submission of specimen other than nasopharyngeal swab, presence of viral mutation(s) within the areas targeted by this assay, and inadequate number of viral copies(<138 copies/mL). A negative result must be combined with clinical observations, patient history, and epidemiological information. The expected result is Negative.  Fact Sheet for  Patients:  BloggerCourse.com  Fact Sheet for Healthcare Providers:  SeriousBroker.it  This test is no t yet approved or cleared by the [place]  FDA and  has been authorized for detection and/or diagnosis of SARS-CoV-2 by FDA under an Emergency Use Authorization (EUA). This EUA will remain  in effect (meaning this test can be used) for the duration of the COVID-19 declaration under Section 564(b)(1) of the Act, 21 U.S.C.section 360bbb-3(b)(1), unless the authorization is terminated  or revoked sooner.       Influenza A by PCR NEGATIVE NEGATIVE Final   Influenza B by PCR NEGATIVE NEGATIVE Final    Comment: (NOTE) The Xpert Xpress SARS-CoV-2/FLU/RSV plus assay is intended as an aid in the diagnosis of influenza from Nasopharyngeal swab specimens and should not be used as a sole basis for treatment. Nasal washings and aspirates are unacceptable for Xpert Xpress SARS-CoV-2/FLU/RSV testing.  Fact Sheet for Patients: BloggerCourse.com  Fact Sheet for Healthcare Providers: SeriousBroker.it  This test is not yet approved or cleared by the [place]  FDA  and has been authorized for detection and/or diagnosis of SARS-CoV-2 by FDA under an Emergency Use Authorization (EUA). This EUA will remain in effect (meaning this test can be used) for the duration of the COVID-19 declaration under Section 564(b)(1) of the Act, 21 U.S.C. section 360bbb-3(b)(1), unless the authorization is terminated or revoked.     Resp Syncytial Virus by PCR NEGATIVE NEGATIVE Final    Comment: (NOTE) Fact Sheet for Patients: BloggerCourse.com  Fact Sheet for Healthcare Providers: SeriousBroker.it  This test is not yet approved or cleared by the [place]  FDA and has been authorized for detection and/or diagnosis of SARS-CoV-2 by FDA under an Emergency Use Authorization (EUA). This EUA will remain in effect (meaning this test can be used) for the duration of the COVID-19 declaration under Section 564(b)(1) of the Act, 21 U.S.C. section 360bbb-3(b)(1), unless the authorization is terminated or revoked.  Performed at [facility], [address], [place], [place] [address]      Radiology Studies: CT CHEST ABDOMEN PELVIS W CONTRAST Result Date: 04/26/2024 EXAM: CT CHEST, ABDOMEN AND PELVIS WITH CONTRAST 04/26/2024 02:14:06 AM TECHNIQUE: CT of the chest, abdomen and pelvis was performed with the administration of intravenous contrast. Multiplanar reformatted images are provided for review. Automated exposure control, iterative reconstruction, and/or weight based adjustment of the mA/kV was utilized to reduce the radiation dose to as low as reasonably achievable. COMPARISON: CT abdomen / pelvis dated 08/21/2022. CLINICAL HISTORY: Sepsis; fever, altered mental status, leukocytosis; thick cough, non-diagnostic CXR. Per ed notes; Pt. Brought in via POV by daughter and husband for high fever at home, reports 101.2 axillary temp at 2230, daughter states decreased LOC, immobile today, won't talk today, would not  respond to sternal rub by daughter who is CRNA, also a wet cough. FINDINGS: CHEST: MEDIASTINUM: Mild coronary atherosclerosis of the LAD. The central airways are clear. THORACIC LYMPH NODES: No mediastinal, hilar or axillary lymphadenopathy. LUNGS AND PLEURA: Mild patchy right basilar opacity, favoring mild infection/pneumonia over atelectasis. No pleural effusion or pneumothorax. ABDOMEN AND PELVIS: LIVER: Subcentimeter hepatic cysts. GALLBLADDER AND BILE DUCTS: Gallbladder is unremarkable. No biliary ductal dilatation. SPLEEN: No acute abnormality. PANCREAS: No acute abnormality. ADRENAL GLANDS: No acute abnormality. KIDNEYS, URETERS AND BLADDER: 3.7 cm simple right lower pole renal cyst (image 71), motion degraded, but benign (Bosniak 1), no follow-up is recommended. No stones in the kidneys or ureters. No hydronephrosis. No perinephric or periureteral stranding. Urinary bladder is unremarkable.  GI AND BOWEL: Sigmoid diverticulosis, without evidence of diverticulitis. The appendix is not discretely visualized. Stomach demonstrates no acute abnormality. There is no bowel obstruction. REPRODUCTIVE ORGANS: No acute abnormality. PERITONEUM AND RETROPERITONEUM: No ascites. No free air. VASCULATURE: Mild thoracic aortic atherosclerosis. Atherosclerotic calcifications of the abdominal aorta and branch vessels, although patent. ABDOMINAL AND PELVIS LYMPH NODES: No lymphadenopathy. BONES AND SOFT TISSUES: Severe compression fracture deformity at T12 (sagittal image 54), chronic. Severe compression fracture deformity at T3 (sagittal image 49), likely chronic, not previously imaged. Mild degenerative changes of the visualized thoracolumbar spine, most prominent at L5-S1. No focal soft tissue abnormality. LIMITATIONS/ARTIFACTS: Motion degraded images. IMPRESSION: 1. Mild patchy right basilar opacity, favoring mild infection/pneumonia over atelectasis. 2. Sigmoid diverticulosis, without evidence of diverticulitis.  Electronically signed by: Zadie Herter MD 04/26/2024 02:25 AM EDT RP Workstation: [id number]   DG Chest Port 1 View Result Date: 04/25/2024 CLINICAL DATA:  Fever with decreased mentation and mobility. EXAM: PORTABLE CHEST 1 VIEW COMPARISON:  Portable chest 11/22/2022 FINDINGS: There is increased elevation of the right hemidiaphragm obscuring the right base. The visualized lungs are clear. The cardiomediastinal silhouette and vasculature are normal. Osteopenia. IMPRESSION: Increased elevation of the right hemidiaphragm obscuring the right base. No evidence of acute chest disease. Electronically Signed   By: Denman Fischer M.D.   On: 04/25/2024 23:36    Scheduled Meds: Continuous Infusions:   LOS: 1 day  MDM: Patient is high risk for one or more organ failure.  They necessitate ongoing hospitalization for continued IV therapies and subsequent lab monitoring. Total time spent interpreting labs and vitals, reviewing the medical record, coordinating care amongst consultants and care team members, directly assessing and discussing care with the patient and/or family: 35 min  Robinn Overholt, DO [facility]  To contact the attending physician between 7A-7P please use Epic Chat. To contact the covering physician during after hours 7P-7A, please review Amion.  04/27/2024, 4:49 PM   *This document has been created with the assistance of dictation software. Please excuse typographical errors. *

## 2024-04-27 NOTE — Discharge Summary (Signed)
 Physician Discharge Summary   Patient: Barbara Wilkins MRN: [id number] DOB: May 27, 1946  Admit date:     04/25/2024  Discharge date: 04/27/24  Discharge Physician: Roise Cleaver   PCP: Rex Castor, MD   Recommendations at discharge:    Discharging directly to hospice  Discharge Diagnoses: Principal Problem:   Sepsis due to pneumonia [facility]) Active Problems:   Acute metabolic encephalopathy   Migraines   Dementia with behavioral disturbance (HCC)   Constipation   Frequent falls   Hospice care patient  Resolved Problems:   * No resolved hospital problems. *  Hospital Course: Barbara Wilkins is a 78 year old female with advanced mixed dementia, who was admitted by my colleague earlier this morning for community-acquired pneumonia.  Patient was initially started on broad-spectrum antibiotics and admitted to the floor for IV fluids, antibiotic therapy, and telemetry monitoring. Patient's family then elected for hospice care. On 6/14 she was made comfort measures only.  We initially plan to send patient home with home hospice on 6/15 but after further consideration family would like to pursue inpatient hospice facility.  Hospice and palliative care teams were consulted and have made these arrangements for discharge today.  Comfort Measures only - Discontinue all medications that do not provide immediate comfort.  Discontinue all lab draws.  - Palliative care and hospice consulted. - Continue symptomatic support.  Have increased morphine today due to increasing needs.  If she continues to require higher levels of morphine we will initiate a drip. - DC to [facility]   Community-acquired pneumonia - No antibiotics per family request.  Comfort measures only as above   Advanced mixed dementia Migraines Constipation Frequent falls History of cataract 2018 GERD - CMO as above -Family at bedside. Discontinue all medications that do not immediately provide the patient  comfort       Consultants: Palliative care Procedures performed: n/a  Disposition: Hospice care Diet recommendation:  Regular diet DISCHARGE MEDICATION: Allergies as of 04/27/2024   No Known Allergies      Medication List     STOP taking these medications    amitriptyline 10 MG tablet Commonly known as: ELAVIL   celecoxib 100 MG capsule Commonly known as: CELEBREX   docusate sodium 100 MG capsule Commonly known as: COLACE   donepezil 5 MG tablet Commonly known as: ARICEPT   memantine 5 MG tablet Commonly known as: NAMENDA   QUEtiapine 50 MG tablet Commonly known as: SEROQUEL       TAKE these medications    gabapentin 300 MG capsule Commonly known as: NEURONTIN Take 300 mg by mouth at bedtime.        Follow-up Information     Rex Castor, MD Follow up.   Specialty: Internal Medicine Why: hospital follow up Contact information: [address] [place] [address] [phone]                Discharge Exam: Barbara Wilkins Weights   04/25/24 2314  Weight: 44.9 kg   General exam: Chronically ill-appearing, frail, lying in the fetal position.  Sleeping easily.  Does not interact for exam Respiratory system: No work of breathing, symmetric chest wall expansion Cardiovascular system: S1 & S2 heard, RRR.  Skin: No rashes, lesions Psychiatry: Calm, sleeping easily at this time.  Difficult to assess otherwise    Condition at discharge: worsening  The results of significant diagnostics from this hospitalization (including imaging, microbiology, ancillary and laboratory) are listed below for reference.   Imaging Studies: CT CHEST ABDOMEN PELVIS  W CONTRAST Result Date: 04/26/2024 EXAM: CT CHEST, ABDOMEN AND PELVIS WITH CONTRAST 04/26/2024 02:14:06 AM TECHNIQUE: CT of the chest, abdomen and pelvis was performed with the administration of intravenous contrast. Multiplanar reformatted images are provided for review. Automated exposure  control, iterative reconstruction, and/or weight based adjustment of the mA/kV was utilized to reduce the radiation dose to as low as reasonably achievable. COMPARISON: CT abdomen / pelvis dated 08/21/2022. CLINICAL HISTORY: Sepsis; fever, altered mental status, leukocytosis; thick cough, non-diagnostic CXR. Per ed notes; Pt. Brought in via POV by daughter and husband for high fever at home, reports 101.2 axillary temp at 2230, daughter states decreased LOC, immobile today, won't talk today, would not respond to sternal rub by daughter who is CRNA, also a wet cough. FINDINGS: CHEST: MEDIASTINUM: Mild coronary atherosclerosis of the LAD. The central airways are clear. THORACIC LYMPH NODES: No mediastinal, hilar or axillary lymphadenopathy. LUNGS AND PLEURA: Mild patchy right basilar opacity, favoring mild infection/pneumonia over atelectasis. No pleural effusion or pneumothorax. ABDOMEN AND PELVIS: LIVER: Subcentimeter hepatic cysts. GALLBLADDER AND BILE DUCTS: Gallbladder is unremarkable. No biliary ductal dilatation. SPLEEN: No acute abnormality. PANCREAS: No acute abnormality. ADRENAL GLANDS: No acute abnormality. KIDNEYS, URETERS AND BLADDER: 3.7 cm simple right lower pole renal cyst (image 71), motion degraded, but benign (Bosniak 1), no follow-up is recommended. No stones in the kidneys or ureters. No hydronephrosis. No perinephric or periureteral stranding. Urinary bladder is unremarkable. GI AND BOWEL: Sigmoid diverticulosis, without evidence of diverticulitis. The appendix is not discretely visualized. Stomach demonstrates no acute abnormality. There is no bowel obstruction. REPRODUCTIVE ORGANS: No acute abnormality. PERITONEUM AND RETROPERITONEUM: No ascites. No free air. VASCULATURE: Mild thoracic aortic atherosclerosis. Atherosclerotic calcifications of the abdominal aorta and branch vessels, although patent. ABDOMINAL AND PELVIS LYMPH NODES: No lymphadenopathy. BONES AND SOFT TISSUES: Severe compression  fracture deformity at T12 (sagittal image 54), chronic. Severe compression fracture deformity at T3 (sagittal image 49), likely chronic, not previously imaged. Mild degenerative changes of the visualized thoracolumbar spine, most prominent at L5-S1. No focal soft tissue abnormality. LIMITATIONS/ARTIFACTS: Motion degraded images. IMPRESSION: 1. Mild patchy right basilar opacity, favoring mild infection/pneumonia over atelectasis. 2. Sigmoid diverticulosis, without evidence of diverticulitis. Electronically signed by: Zadie Herter MD 04/26/2024 02:25 AM EDT RP Workstation: [id number]   DG Chest Port 1 View Result Date: 04/25/2024 CLINICAL DATA:  Fever with decreased mentation and mobility. EXAM: PORTABLE CHEST 1 VIEW COMPARISON:  Portable chest 11/22/2022 FINDINGS: There is increased elevation of the right hemidiaphragm obscuring the right base. The visualized lungs are clear. The cardiomediastinal silhouette and vasculature are normal. Osteopenia. IMPRESSION: Increased elevation of the right hemidiaphragm obscuring the right base. No evidence of acute chest disease. Electronically Signed   By: Denman Fischer M.D.   On: 04/25/2024 23:36    Microbiology: Results for orders placed or performed during the hospital encounter of 04/25/24  Blood Culture (routine x 2)     Status: None (Preliminary result)   Collection Time: 04/26/24 12:48 AM   Specimen: BLOOD  Result Value Ref Range Status   Specimen Description BLOOD BLOOD RIGHT ARM  Final   Special Requests   Final    BOTTLES DRAWN AEROBIC AND ANAEROBIC Blood Culture adequate volume   Culture   Final    NO GROWTH 1 DAY Performed at [facility], [address] [place], [place] [address]    Report Status PENDING  Incomplete  Blood Culture (routine x 2)     Status: None (Preliminary result)   Collection  Time: 04/26/24 12:48 AM   Specimen: BLOOD  Result Value Ref Range Status   Specimen Description BLOOD BLOOD LEFT ARM  Final   Special  Requests   Final    BOTTLES DRAWN AEROBIC AND ANAEROBIC Blood Culture results may not be optimal due to an inadequate volume of blood received in culture bottles   Culture   Final    NO GROWTH 1 DAY Performed at [facility], [address], [place], [place] [address]    Report Status PENDING  Incomplete  Resp panel by RT-PCR (RSV, Flu A&B, Covid) Urine, Clean Catch     Status: None   Collection Time: 04/26/24 12:49 AM   Specimen: Urine, Clean Catch; Nasal Swab  Result Value Ref Range Status   SARS Coronavirus 2 by RT PCR NEGATIVE NEGATIVE Final    Comment: (NOTE) SARS-CoV-2 target nucleic acids are NOT DETECTED.  The SARS-CoV-2 RNA is generally detectable in upper respiratory specimens during the acute phase of infection. The lowest concentration of SARS-CoV-2 viral copies this assay can detect is 138 copies/mL. A negative result does not preclude SARS-Cov-2 infection and should not be used as the sole basis for treatment or other patient management decisions. A negative result may occur with  improper specimen collection/handling, submission of specimen other than nasopharyngeal swab, presence of viral mutation(s) within the areas targeted by this assay, and inadequate number of viral copies(<138 copies/mL). A negative result must be combined with clinical observations, patient history, and epidemiological information. The expected result is Negative.  Fact Sheet for Patients:  BloggerCourse.com  Fact Sheet for Healthcare Providers:  SeriousBroker.it  This test is no t yet approved or cleared by the [place]  FDA and  has been authorized for detection and/or diagnosis of SARS-CoV-2 by FDA under an Emergency Use Authorization (EUA). This EUA will remain  in effect (meaning this test can be used) for the duration of the COVID-19 declaration under Section 564(b)(1) of the Act, 21 U.S.C.section 360bbb-3(b)(1), unless  the authorization is terminated  or revoked sooner.       Influenza A by PCR NEGATIVE NEGATIVE Final   Influenza B by PCR NEGATIVE NEGATIVE Final    Comment: (NOTE) The Xpert Xpress SARS-CoV-2/FLU/RSV plus assay is intended as an aid in the diagnosis of influenza from Nasopharyngeal swab specimens and should not be used as a sole basis for treatment. Nasal washings and aspirates are unacceptable for Xpert Xpress SARS-CoV-2/FLU/RSV testing.  Fact Sheet for Patients: BloggerCourse.com  Fact Sheet for Healthcare Providers: SeriousBroker.it  This test is not yet approved or cleared by the [place]  FDA and has been authorized for detection and/or diagnosis of SARS-CoV-2 by FDA under an Emergency Use Authorization (EUA). This EUA will remain in effect (meaning this test can be used) for the duration of the COVID-19 declaration under Section 564(b)(1) of the Act, 21 U.S.C. section 360bbb-3(b)(1), unless the authorization is terminated or revoked.     Resp Syncytial Virus by PCR NEGATIVE NEGATIVE Final    Comment: (NOTE) Fact Sheet for Patients: BloggerCourse.com  Fact Sheet for Healthcare Providers: SeriousBroker.it  This test is not yet approved or cleared by the [place]  FDA and has been authorized for detection and/or diagnosis of SARS-CoV-2 by FDA under an Emergency Use Authorization (EUA). This EUA will remain in effect (meaning this test can be used) for the duration of the COVID-19 declaration under Section 564(b)(1) of the Act, 21 U.S.C. section 360bbb-3(b)(1), unless the authorization is terminated or revoked.  Performed at  [facility], [address] [place], [place] [address]     Labs: CBC: Recent Labs  Lab 04/26/24 0049  WBC 12.2*  NEUTROABS 9.7*  HGB 12.1  HCT 37.8  MCV 90.6  PLT 241   Basic Metabolic Panel: Recent Labs  Lab  04/26/24 0049  NA 139  K 3.6  CL 107  CO2 23  GLUCOSE 104*  BUN 22  CREATININE 0.64  CALCIUM 8.8*   Liver Function Tests: Recent Labs  Lab 04/26/24 0049  AST 21  ALT 12  ALKPHOS 75  BILITOT 1.0  PROT 7.0  ALBUMIN 3.5   CBG: No results for input(s): GLUCAP in the last 168 hours.  Discharge time spent: 32 minutes.  Signed: Wakisha Alberts, DO [facility] 04/27/2024

## 2024-04-27 NOTE — Progress Notes (Signed)
 [facility] LIAISON NOTE  Received request from Awanya Caesar, Transitions of Care (TOC), for hospice services at home after discharge. Spoke with Lynda Kuriakose, daughter,  to initiate education related to hospice philosophy, services, and team approach to care. Patient/family verbalized understanding of information given. Per discussion, the plan is for discharge home today by EMS.  DME needs discussed.   Patient/family requests the following equipment for delivery:   hospital bed and over bed table.  (They do not want to wait for delivery and said it is okay for her to d/c today and it can be delivered tomorrow)                The address has been verified and is correct in the chart. Lynda Waldman is the family contact to arrange time of equipment delivery.  Please send signed and completed DNR home with patient/family if applicable.   Please provide prescriptions at discharge as needed to ensure ongoing symptom management.  [facility] information and contact numbers given to Lynda.  Above information shared with Clearence Curet, TOC and hospital medical care team.  Please call with any hospice related questions or concerns.  Thank you for the opportunity to participate in this patient's care.  Ambrosio Junker, MA, BSN, RN, FNE Nurse Liaison [phone]

## 2024-04-27 NOTE — Plan of Care (Signed)
  Problem: Activity: Goal: Risk for activity intolerance will decrease Outcome: Progressing   Problem: Coping: Goal: Level of anxiety will decrease Outcome: Progressing   Problem: Elimination: Goal: Will not experience complications related to urinary retention Outcome: Progressing   Problem: Pain Managment: Goal: General experience of comfort will improve and/or be controlled Outcome: Progressing   Problem: Safety: Goal: Ability to remain free from injury will improve Outcome: Progressing   Problem: Skin Integrity: Goal: Risk for impaired skin integrity will decrease Outcome: Progressing

## 2024-04-29 LAB — BLOOD CULTURE ID PANEL (REFLEXED) - BCID2

## 2024-04-29 NOTE — Progress Notes (Signed)
 PHARMACY - PHYSICIAN COMMUNICATION CRITICAL VALUE ALERT - BLOOD CULTURE IDENTIFICATION (BCID)  Barbara Wilkins is an 78 y.o. female who presented to [facility] on 04/25/2024 with a chief complaint of sepsis from PNA  Assessment:  GNR in 1 of 4 bottles, BCID did not recognize species so sample sent to [facility] for further work-up.  (include suspected source if known)  Name of physician (or Provider) Contacted: None,  pt discharged 6/15 PM by Dr Marquette Sites,  pt on comfort care and family requests no more abx   Current antibiotics: none   Changes to prescribed antibiotics recommended:  - unlikely any changes needed,  will pass off to AM pharmacist and message MD in AM   Results for orders placed or performed during the hospital encounter of 04/25/24  Blood Culture ID Panel (Reflexed) (Collected: 04/26/2024 12:48 AM)  Result Value Ref Range   Enterococcus faecalis NOT DETECTED NOT DETECTED   Enterococcus Faecium NOT DETECTED NOT DETECTED   Listeria monocytogenes NOT DETECTED NOT DETECTED   Staphylococcus species NOT DETECTED NOT DETECTED   Staphylococcus aureus (BCID) NOT DETECTED NOT DETECTED   Staphylococcus epidermidis NOT DETECTED NOT DETECTED   Staphylococcus lugdunensis NOT DETECTED NOT DETECTED   Streptococcus species NOT DETECTED NOT DETECTED   Streptococcus agalactiae NOT DETECTED NOT DETECTED   Streptococcus pneumoniae NOT DETECTED NOT DETECTED   Streptococcus pyogenes NOT DETECTED NOT DETECTED   A.calcoaceticus-baumannii NOT DETECTED NOT DETECTED   Bacteroides fragilis NOT DETECTED NOT DETECTED   Enterobacterales NOT DETECTED NOT DETECTED   Enterobacter cloacae complex NOT DETECTED NOT DETECTED   Escherichia coli NOT DETECTED NOT DETECTED   Klebsiella aerogenes NOT DETECTED NOT DETECTED   Klebsiella oxytoca NOT DETECTED NOT DETECTED   Klebsiella pneumoniae NOT DETECTED NOT DETECTED   Proteus species NOT DETECTED NOT DETECTED   Salmonella species NOT DETECTED NOT DETECTED    Serratia marcescens NOT DETECTED NOT DETECTED   Haemophilus influenzae NOT DETECTED NOT DETECTED   Neisseria meningitidis NOT DETECTED NOT DETECTED   Pseudomonas aeruginosa NOT DETECTED NOT DETECTED   Stenotrophomonas maltophilia NOT DETECTED NOT DETECTED   Candida albicans NOT DETECTED NOT DETECTED   Candida auris NOT DETECTED NOT DETECTED   Candida glabrata NOT DETECTED NOT DETECTED   Candida krusei NOT DETECTED NOT DETECTED   Candida parapsilosis NOT DETECTED NOT DETECTED   Candida tropicalis NOT DETECTED NOT DETECTED   Cryptococcus neoformans/gattii NOT DETECTED NOT DETECTED    Rykar Lebleu D 04/29/2024  1:11 AM

## 2024-05-02 LAB — CULTURE, BLOOD (ROUTINE X 2)

## 2024-05-07 LAB — CULTURE, BLOOD (ROUTINE X 2)
Culture: NO GROWTH
Special Requests: ADEQUATE

## 2024-05-13 DEATH — deceased

## 2024-08-20 ENCOUNTER — Ambulatory Visit: Payer: Medicare HMO | Admitting: Dermatology
